# Patient Record
Sex: Male | Born: 1955
Health system: Southern US, Community
[De-identification: ages and names within clinical notes are randomized; demographics above are authoritative.]

## PROBLEM LIST (undated history)

## (undated) DIAGNOSIS — K219 Gastro-esophageal reflux disease without esophagitis: Secondary | ICD-10-CM

## (undated) DIAGNOSIS — I10 Essential (primary) hypertension: Secondary | ICD-10-CM

## (undated) DIAGNOSIS — K5790 Diverticulosis of intestine, part unspecified, without perforation or abscess without bleeding: Secondary | ICD-10-CM

## (undated) DIAGNOSIS — K635 Polyp of colon: Secondary | ICD-10-CM

## (undated) DIAGNOSIS — E785 Hyperlipidemia, unspecified: Secondary | ICD-10-CM

## (undated) HISTORY — DX: Diverticulosis of intestine, part unspecified, without perforation or abscess without bleeding: K57.90

## (undated) HISTORY — DX: Polyp of colon: K63.5

## (undated) HISTORY — DX: Gastro-esophageal reflux disease without esophagitis: K21.9

## (undated) HISTORY — PX: KNEE ARTHROSCOPY: SUR90

## (undated) HISTORY — DX: Essential (primary) hypertension: I10

## (undated) HISTORY — PX: HERNIA REPAIR: SHX51

## (undated) HISTORY — PX: CARPAL TUNNEL RELEASE: SHX101

## (undated) HISTORY — DX: Hyperlipidemia, unspecified: E78.5

---

## 2007-02-10 ENCOUNTER — Encounter: Admission: RE | Admit: 2007-02-10 | Discharge: 2007-02-10 | Payer: Self-pay | Admitting: Family Medicine

## 2009-01-28 ENCOUNTER — Encounter (INDEPENDENT_AMBULATORY_CARE_PROVIDER_SITE_OTHER): Payer: Self-pay | Admitting: *Deleted

## 2009-07-20 HISTORY — PX: COLONOSCOPY: SHX174

## 2009-07-29 ENCOUNTER — Encounter (INDEPENDENT_AMBULATORY_CARE_PROVIDER_SITE_OTHER): Payer: Self-pay | Admitting: *Deleted

## 2009-08-26 ENCOUNTER — Ambulatory Visit: Payer: Self-pay | Admitting: Gastroenterology

## 2009-08-26 ENCOUNTER — Encounter (INDEPENDENT_AMBULATORY_CARE_PROVIDER_SITE_OTHER): Payer: Self-pay | Admitting: *Deleted

## 2009-09-09 ENCOUNTER — Ambulatory Visit: Payer: Self-pay | Admitting: Gastroenterology

## 2010-08-13 ENCOUNTER — Ambulatory Visit
Admission: RE | Admit: 2010-08-13 | Discharge: 2010-08-13 | Payer: Self-pay | Source: Home / Self Care | Attending: Physician Assistant | Admitting: Physician Assistant

## 2010-08-13 ENCOUNTER — Ambulatory Visit: Admission: RE | Admit: 2010-08-13 | Discharge: 2010-08-13 | Payer: Self-pay | Source: Home / Self Care

## 2010-08-19 NOTE — Procedures (Signed)
Summary: Colonoscopy  Patient: Nathaniel Kelly Note: All result statuses are Final unless otherwise noted.  Tests: (1) Colonoscopy (COL)   COL Colonoscopy           DONE     [facility]     [address]     [place], [place]  [address]           COLONOSCOPY PROCEDURE REPORT           PATIENT:  Nathaniel, Kelly  MR#:  [id number]     BIRTHDATE:  [birth year], 54 yrs. old  GENDER:  male           ENDOSCOPIST:  Barbette Hair. Arlyce Dice, MD     Referred by:           PROCEDURE DATE:  09/09/2009     PROCEDURE:  Colonoscopy, Diagnostic     ASA CLASS:  Class II     INDICATIONS:  history of pre-cancerous (adenomatous) colon polyps                 MEDICATIONS:   Fentanyl 75 mcg IV, Versed 9 mg IV           DESCRIPTION OF PROCEDURE:   After the risks benefits and     alternatives of the procedure were thoroughly explained, informed     consent was obtained.  Digital rectal exam was performed and     revealed no abnormalities.   The LB CF-[id number] endoscope     was introduced through the anus and advanced to the cecum, which     was identified by both the appendix and ileocecal valve, without     limitations.  The quality of the prep was excellent, using     MoviPrep.  The instrument was then slowly withdrawn as the colon     was fully examined.     <<PROCEDUREIMAGES>>           FINDINGS:  Mild diverticulosis was found in the sigmoid colon (see     image1 and image11).  This was otherwise a normal examination of     the colon (see image2, image3, image5, image8, image9, image12,     and image13).   Retroflexed views in the rectum revealed no     abnormalities.    The scope was then withdrawn from the patient     and the procedure completed.           COMPLICATIONS:  None           ENDOSCOPIC IMPRESSION:     1) Mild diverticulosis in the sigmoid colon     2) Otherwise normal examination     RECOMMENDATIONS:     1) colonoscopy 10 years           REPEAT EXAM:  In 10 year(s) for  Colonoscopy.           ______________________________     Barbette Hair. Arlyce Dice, MD           CC:  Rudi Heap, MD           n.     Rosalie Doctor:   Barbette Hair. Kaplan at 09/09/2009 09:44 AM           Daleen Snook, [phone]  Note: An exclamation mark (!) indicates a result that was not dispersed into the flowsheet. Document Creation Date: 09/09/2009 9:44 AM _______________________________________________________________________  (1) Order result status: Final Collection or observation date-time: 09/09/2009 09:38 Requested date-time:  Receipt  date-time:  Reported date-time:  Referring Physician:   Ordering Physician: [name] [phone]) Specimen Source:  Source: Launa Grill Order Number: [id number] Lab site:   Appended Document: Colonoscopy    Clinical Lists Changes  Observations: Added new observation of COLONNXTDUE: 08/2019 (09/09/2009 11:02)

## 2010-08-19 NOTE — Miscellaneous (Signed)
Summary: [facility]  Clinical Lists Changes  Medications: Added new medication of MOVIPREP 100 GM  SOLR (PEG-KCL-NACL-NASULF-NA ASC-C) As per prep instructions. - Signed Rx of MOVIPREP 100 GM  SOLR (PEG-KCL-NACL-NASULF-NA ASC-C) As per prep instructions.;  #1 x 0;  Signed;  Entered by: Ezra Sites RN;  Authorized by: Louis Meckel MD;  Method used: Electron[facility] to [facility]  [phone]*, [address] [place], [place]  [address], Ph: [phone] or [phone], Fax: [phone] Observations: Added new observation of NKA: T (08/26/2009 15:52)    Prescriptions: MOVIPREP 100 GM  SOLR (PEG-KCL-NACL-NASULF-NA ASC-C) As per prep instructions.  #1 x 0   Entered by:   Ezra Sites RN   Authorized by:   Louis Meckel MD   Signed by:   Ezra Sites RN on 08/26/2009   Method used:   [facility]ronically to        [facility]  [phone]* (retail)       [address] [place], [place]  [address]       Ph: [phone] or [phone]       Fax: [phone]   RxID:   [id number]

## 2010-08-19 NOTE — Letter (Signed)
Summary: [facility] Instructions  [facility]  [address] [place], [place] [address]   Phone: [phone]  Fax: [phone]       Nathaniel Kelly    [birth year]    MRN: [id number]        Procedure Day /Date:  Monday 09/09/2009     Arrival Time: 8:00 am      Procedure Time: 9:00 am     Location of Procedure:                    _ x_  [facility] (4th Floor)                        PREPARATION FOR COLONOSCOPY WITH MOVIPREP   Starting 5 days prior to your procedure Thursday 2/17 do not eat nuts, seeds, popcorn, corn, beans, peas,  salads, or any raw vegetables.  Do not take any fiber supplements (e.g. Metamucil, Citrucel, and Benefiber).  THE DAY BEFORE YOUR PROCEDURE         DATE: Sunday 2/20  1.  Drink clear liquids the entire day-NO SOLID FOOD  2.  Do not drink anything colored red or purple.  Avoid juices with pulp.  No orange juice.  3.  Drink at least 64 oz. (8 glasses) of fluid/clear liquids during the day to prevent dehydration and help the prep work efficiently.  CLEAR LIQUIDS INCLUDE: Water Jello Ice Popsicles Tea (sugar ok, no milk/cream) Powdered fruit flavored drinks Coffee (sugar ok, no milk/cream) Gatorade Juice: apple, white grape, white cranberry  Lemonade Clear bullion, consomm, broth Carbonated beverages (any kind) Strained chicken noodle soup Hard Candy                             4.  In the morning, mix first dose of MoviPrep solution:    Empty 1 Pouch A and 1 Pouch B into the disposable container    Add lukewarm drinking water to the top line of the container. Mix to dissolve    Refrigerate (mixed solution should be used within 24 hrs)  5.  Begin drinking the prep at 5:00 p.m. The MoviPrep container is divided by 4 marks.   Every 15 minutes drink the solution down to the next mark (approximately 8 oz) until the full liter is complete.   6.  Follow completed prep with 16 oz of clear liquid of your choice (Nothing  red or purple).  Continue to drink clear liquids until bedtime.  7.  Before going to bed, mix second dose of MoviPrep solution:    Empty 1 Pouch A and 1 Pouch B into the disposable container    Add lukewarm drinking water to the top line of the container. Mix to dissolve    Refrigerate  THE DAY OF YOUR PROCEDURE      DATE: Monday 2/21  Beginning at4:00 a.m. (5 hours before procedure):         1. Every 15 minutes, drink the solution down to the next mark (approx 8 oz) until the full liter is complete.  2. Follow completed prep with 16 oz. of clear liquid of your choice.    3. You may drink clear liquids until 7:00 am  (2 HOURS BEFORE PROCEDURE).   MEDICATION INSTRUCTIONS  Unless otherwise instructed, you should take regular prescription medications with a small sip of water   as early as possible the morning of  your procedure.           OTHER INSTRUCTIONS  You will need a responsible adult at least 57 years of age to accompany you and drive you home.   This person must remain in the waiting room during your procedure.  Wear loose fitting clothing that is easily removed.  Leave jewelry and other valuables at home.  However, you may wish to bring a book to read or  an iPod/MP3 player to listen to music as you wait for your procedure to start.  Remove all body piercing jewelry and leave at home.  Total time from sign-in until discharge is approximately 2-3 hours.  You should go home directly after your procedure and rest.  You can resume normal activities the  day after your procedure.  The day of your procedure you should not:   Drive   Make legal decisions   Operate machinery   Drink alcohol   Return to work  You will receive specific instructions about eating, activities and medications before you leave.    The above instructions have been reviewed and explained to me by   Ezra Sites RN  August 26, 2009 4:15 PM    I fully understand and can  verbalize these instructions _____________________________ Date _________

## 2011-01-12 NOTE — Letter (Signed)
Summary: Previsit letter  [facility]  [address] [place], [place] [address]   Phone: [phone]  Fax: [phone]       07/29/2009 MRN: [id number]  Nathaniel Kelly [address] [place], [place]  [address]  [place]  Dear [name],  Welcome to the Gastroenterology Division at [facility].    You are scheduled to see a nurse for your pre-procedure visit on 08-09-09 at 3:30pm on the 3rd floor at [facility], [address]  We ask that you try to arrive at our office 15 minutes prior to your appointment time to allow for check-in.  Your nurse visit will consist of discussing your medical and surgical history, your immediate family medical history, and your medications.    Please bring a complete list of all your medications or, if you prefer, bring the medication bottles and we will list them.  We will need to be aware of both prescribed and over the counter drugs.  We will need to know exact dosage information as well.  If you are on blood thinners (Coumadin, Plavix, Aggrenox, Ticlid, etc.) please call our office today/prior to your appointment, as we need to consult with your physician about holding your medication.   Please be prepared to read and sign documents such as consent forms, a financial agreement, and acknowledgement forms.  If necessary, and with your consent, a friend or relative is welcome to sit-in on the nurse visit with you.  Please bring your insurance card so that we may make a copy of it.  If your insurance requires a referral to see a specialist, please bring your referral form from your primary care physician.  No co-pay is required for this nurse visit.     If you cannot keep your appointment, please call [phone] to cancel or reschedule prior to your appointment date.  This allows [place] the opportunity to schedule an appointment for another patient in need of care.    Thank you for choosing [facility] for your medical needs.   We appreciate the opportunity to care for you.  Please visit [place] at our website  to learn more about our practice.                     Sincerely.                                                                                                                   The Gastroenterology Division

## 2011-02-20 ENCOUNTER — Emergency Department (HOSPITAL_COMMUNITY)
Admission: EM | Admit: 2011-02-20 | Discharge: 2011-02-20 | Disposition: A | Discharge status: Home / Self Care | Payer: BC Managed Care – PPO | Attending: Emergency Medicine | Admitting: Emergency Medicine

## 2011-02-20 DIAGNOSIS — I10 Essential (primary) hypertension: Secondary | ICD-10-CM | POA: Insufficient documentation

## 2011-02-20 DIAGNOSIS — E7800 Pure hypercholesterolemia, unspecified: Secondary | ICD-10-CM | POA: Insufficient documentation

## 2011-02-20 DIAGNOSIS — H1130 Conjunctival hemorrhage, unspecified eye: Secondary | ICD-10-CM | POA: Insufficient documentation

## 2012-09-30 ENCOUNTER — Encounter: Payer: Self-pay | Admitting: Gastroenterology

## 2012-10-19 ENCOUNTER — Encounter: Payer: Self-pay | Admitting: Gastroenterology

## 2012-10-19 ENCOUNTER — Ambulatory Visit (INDEPENDENT_AMBULATORY_CARE_PROVIDER_SITE_OTHER): Payer: BC Managed Care – PPO | Admitting: Gastroenterology

## 2012-10-19 VITALS — BP 132/80 | HR 64 | Ht 65.0 in | Wt 143.0 lb

## 2012-10-19 DIAGNOSIS — K648 Other hemorrhoids: Secondary | ICD-10-CM

## 2012-10-19 HISTORY — DX: Other hemorrhoids: K64.8

## 2012-10-19 NOTE — Patient Instructions (Addendum)
Your hospital procedure has been scheduled Separate instructions given

## 2012-10-19 NOTE — Progress Notes (Signed)
History of Present Illness: Pleasant 57-year-old white male referred at the request of Dr. Moore for evaluation of hemorrhoids. He's had hemorrhoidal symptoms for years characterized by pain, protrusion and occasional bleeding. He's used various topicals without sustained relief.  Last colonoscopy in 2011 demonstrated diverticulosis. He moves his bowels regularly.  Patient has had reflux for years that is well controlled with PPI therapy. He denies dysphagia.    Past Medical History  Diagnosis Date  . Diverticulosis   . Hypertension   . Hyperlipemia   . Colon polyps    Past Surgical History  Procedure Laterality Date  . Hernia repair    . Knee arthroscopy      bilateral   . Carpal tunnel release     family history includes Colon cancer in his paternal aunt and paternal grandmother. Current Outpatient Prescriptions  Medication Sig Dispense Refill  . lisinopril (PRINIVIL,ZESTRIL) 20 MG tablet Please take as directed      . omeprazole (PRILOSEC) 40 MG capsule Please take as directed       No current facility-administered medications for this visit.   Allergies as of 10/19/2012  . (No Known Allergies)    reports that he has quit smoking. He has never used smokeless tobacco. He reports that  drinks alcohol. He reports that he does not use illicit drugs.     Review of Systems: Pertinent positive and negative review of systems were noted in the above HPI section. All other review of systems were otherwise negative.  Vital signs were reviewed in today's medical record Physical Exam: General: Well developed , well nourished, no acute distress Skin: anicteric Head: Normocephalic and atraumatic Eyes:  sclerae anicteric, EOMI Ears: Normal auditory acuity Mouth: No deformity or lesions Neck: Supple, no masses or thyromegaly Lungs: Clear throughout to auscultation Heart: Regular rate and rhythm; no murmurs, rubs or bruits Abdomen: Soft, non tender and non distended. No masses,  hepatosplenomegaly or hernias noted. Normal Bowel sounds Rectal: There are no external lesions Musculoskeletal: Symmetrical with no gross deformities  Skin: No lesions on visible extremities Pulses:  Normal pulses noted Extremities: No clubbing, cyanosis, edema or deformities noted Neurological: Alert oriented x 4, grossly nonfocal Cervical Nodes:  No significant cervical adenopathy Inguinal Nodes: No significant inguinal adenopathy Psychological:  Alert and cooperative. Normal mood and affect       

## 2012-10-19 NOTE — Assessment & Plan Note (Signed)
History of grade 2-3 hemorrhoids symptomatic despite medical therapy  Recommendations #1 therapies for hemorrhoids including band ligation and surgical thyroidectomy were discussed. Patient is interested in band ligation.

## 2012-10-20 ENCOUNTER — Telehealth: Payer: Self-pay | Admitting: *Deleted

## 2012-10-20 NOTE — Telephone Encounter (Signed)
See other phone note.Marland Kitchenappoints made

## 2012-11-07 ENCOUNTER — Ambulatory Visit (HOSPITAL_COMMUNITY)
Admission: RE | Admit: 2012-11-07 | Discharge: 2012-11-07 | Disposition: A | Discharge status: Home / Self Care | Payer: BC Managed Care – PPO | Source: Ambulatory Visit | Attending: Gastroenterology | Admitting: Gastroenterology

## 2012-11-07 ENCOUNTER — Encounter (HOSPITAL_COMMUNITY)
Admission: RE | Disposition: A | Discharge status: Home / Self Care | Payer: Self-pay | Source: Ambulatory Visit | Attending: Gastroenterology

## 2012-11-07 ENCOUNTER — Encounter (HOSPITAL_COMMUNITY): Payer: Self-pay | Admitting: *Deleted

## 2012-11-07 DIAGNOSIS — E785 Hyperlipidemia, unspecified: Secondary | ICD-10-CM | POA: Insufficient documentation

## 2012-11-07 DIAGNOSIS — Z860100 Personal history of colon polyps, unspecified: Secondary | ICD-10-CM | POA: Insufficient documentation

## 2012-11-07 DIAGNOSIS — Z79899 Other long term (current) drug therapy: Secondary | ICD-10-CM | POA: Insufficient documentation

## 2012-11-07 DIAGNOSIS — K648 Other hemorrhoids: Secondary | ICD-10-CM

## 2012-11-07 DIAGNOSIS — K5730 Diverticulosis of large intestine without perforation or abscess without bleeding: Secondary | ICD-10-CM | POA: Insufficient documentation

## 2012-11-07 DIAGNOSIS — K219 Gastro-esophageal reflux disease without esophagitis: Secondary | ICD-10-CM | POA: Insufficient documentation

## 2012-11-07 DIAGNOSIS — Z87891 Personal history of nicotine dependence: Secondary | ICD-10-CM | POA: Insufficient documentation

## 2012-11-07 DIAGNOSIS — Z800 Family history of malignant neoplasm of digestive organs: Secondary | ICD-10-CM | POA: Insufficient documentation

## 2012-11-07 DIAGNOSIS — I10 Essential (primary) hypertension: Secondary | ICD-10-CM | POA: Insufficient documentation

## 2012-11-07 HISTORY — PX: FLEXIBLE SIGMOIDOSCOPY: SHX5431

## 2012-11-07 HISTORY — PX: HEMORRHOID BANDING: SHX5850

## 2012-11-07 SURGERY — SIGMOIDOSCOPY, FLEXIBLE
Anesthesia: Moderate Sedation

## 2012-11-07 MED ORDER — MIDAZOLAM HCL 10 MG/2ML IJ SOLN
INTRAMUSCULAR | Status: AC
  Filled 2012-11-07: qty 2

## 2012-11-07 MED ORDER — FENTANYL CITRATE 0.05 MG/ML IJ SOLN
INTRAMUSCULAR | Status: DC | PRN
  Administered 2012-11-07 (×2): 25 ug via INTRAVENOUS

## 2012-11-07 MED ORDER — SODIUM CHLORIDE 0.9 % IV SOLN
INTRAVENOUS | Status: DC
  Administered 2012-11-07: 08:00:00 via INTRAVENOUS

## 2012-11-07 MED ORDER — MIDAZOLAM HCL 10 MG/2ML IJ SOLN
INTRAMUSCULAR | Status: DC | PRN
  Administered 2012-11-07: 2 mg via INTRAVENOUS
  Administered 2012-11-07: 1 mg via INTRAVENOUS

## 2012-11-07 MED ORDER — FENTANYL CITRATE 0.05 MG/ML IJ SOLN
INTRAMUSCULAR | Status: AC
  Filled 2012-11-07: qty 2

## 2012-11-07 NOTE — Interval H&P Note (Signed)
History and Physical Interval Note:  11/07/2012 9:43 AM  Nathaniel Kelly  has presented today for surgery, with the diagnosis of hemorrhoids  The various methods of treatment have been discussed with the patient and family. After consideration of risks, benefits and other options for treatment, the patient has consented to  Procedure(s): FLEXIBLE SIGMOIDOSCOPY (N/A) HEMORRHOID BANDING (N/A) as a surgical intervention .  The patient's history has been reviewed, patient examined, no change in status, stable for surgery.  I have reviewed the patient's chart and labs.  Questions were answered to the patient's satisfaction.    The recent H&P (dated *10/19/12**) was reviewed, the patient was examined and there is no change in the patients condition since that H&P was completed.   [name]  11/07/2012, 9:43 AM    [name]

## 2012-11-07 NOTE — H&P (View-Only) (Signed)
History of Present Illness: Pleasant 57 year old white male referred at the request of Dr. Christell Constant for evaluation of hemorrhoids. He's had hemorrhoidal symptoms for years characterized by pain, protrusion and occasional bleeding. He's used various topicals without sustained relief.  Last colonoscopy in 2011 demonstrated diverticulosis. He moves his bowels regularly.  Patient has had reflux for years that is well controlled with PPI therapy. He denies dysphagia.    Past Medical History  Diagnosis Date  . Diverticulosis   . Hypertension   . Hyperlipemia   . Colon polyps    Past Surgical History  Procedure Laterality Date  . Hernia repair    . Knee arthroscopy      bilateral   . Carpal tunnel release     family history includes Colon cancer in his paternal aunt and paternal grandmother. Current Outpatient Prescriptions  Medication Sig Dispense Refill  . lisinopril (PRINIVIL,ZESTRIL) 20 MG tablet Please take as directed      . omeprazole (PRILOSEC) 40 MG capsule Please take as directed       No current facility-administered medications for this visit.   Allergies as of 10/19/2012  . (No Known Allergies)    reports that he has quit smoking. He has never used smokeless tobacco. He reports that  drinks alcohol. He reports that he does not use illicit drugs.     Review of Systems: Pertinent positive and negative review of systems were noted in the above HPI section. All other review of systems were otherwise negative.  Vital signs were reviewed in today's medical record Physical Exam: General: Well developed , well nourished, no acute distress Skin: anicteric Head: Normocephalic and atraumatic Eyes:  sclerae anicteric, EOMI Ears: Normal auditory acuity Mouth: No deformity or lesions Neck: Supple, no masses or thyromegaly Lungs: Clear throughout to auscultation Heart: Regular rate and rhythm; no murmurs, rubs or bruits Abdomen: Soft, non tender and non distended. No masses,  hepatosplenomegaly or hernias noted. Normal Bowel sounds Rectal: There are no external lesions Musculoskeletal: Symmetrical with no gross deformities  Skin: No lesions on visible extremities Pulses:  Normal pulses noted Extremities: No clubbing, cyanosis, edema or deformities noted Neurological: Alert oriented x 4, grossly nonfocal Cervical Nodes:  No significant cervical adenopathy Inguinal Nodes: No significant inguinal adenopathy Psychological:  Alert and cooperative. Normal mood and affect

## 2012-11-07 NOTE — Op Note (Signed)
[facility] [address] [place], [address]   FLEXIBLE SIGMOIDOSCOPY PROCEDURE REPORT  PATIENT: Nathaniel Kelly, Nathaniel Kelly  MR#: [id number] BIRTHDATE: 05-08-1956 , 57  yrs. old GENDER: Male ENDOSCOPIST: Louis Meckel, MD REFERRED BY: Rudi Heap, M.D. PROCEDURE DATE:  11/07/2012 PROCEDURE:   Hemorrhoidectomy via banding, clips or ligation ASA CLASS:   Class II INDICATIONS:therapy of for previously diagnosed hemorrhoids. MEDICATIONS: These medications were titrated to patient response per physician's verbal order, Versed 3 mg IV, and Fentanyl 50 mcg IV  DESCRIPTION OF PROCEDURE:   After the risks benefits and alternatives of the procedure were thoroughly explained, informed consent was obtained.  revealed no abnormalities of the rectum. The endoscope was introduced through the anus  and advanced to the sigmoid colon , limited by No adverse events experienced.   The quality of the prep was    .  The instrument was then slowly withdrawn as the mucosa was fully examined.         COLON FINDINGS: Internal hemorrhoids were found.  There were 2 internal hemorrhoid bundles. 3 bands were placed just above the dentate line incorporating the 2 bundles.  The sigmoid colon was normal. Retroflexed views revealed no abnormalities.    The scope was then withdrawn from the patient and the procedure terminated.  COMPLICATIONS: There were no complications.  ENDOSCOPIC IMPRESSION: internal hemorrhoids-status post band ligation  RECOMMENDATIONS: Office visit one month  REPEAT EXAM:   _______________________________ eSignedLouis Meckel, MD 11/07/2012 10:06 AM   CC:

## 2012-11-08 ENCOUNTER — Encounter (HOSPITAL_COMMUNITY): Payer: Self-pay | Admitting: Gastroenterology

## 2012-12-08 ENCOUNTER — Ambulatory Visit: Payer: BC Managed Care – PPO | Admitting: Gastroenterology

## 2012-12-21 ENCOUNTER — Telehealth: Payer: Self-pay | Admitting: Family Medicine

## 2012-12-22 MED ORDER — OMEPRAZOLE 40 MG PO CPDR: 40.0000 mg | DELAYED_RELEASE_CAPSULE | Freq: Every day | ORAL | Status: DC

## 2012-12-22 NOTE — Telephone Encounter (Signed)
done

## 2012-12-26 ENCOUNTER — Ambulatory Visit: Payer: BC Managed Care – PPO | Admitting: Gastroenterology

## 2013-04-22 ENCOUNTER — Other Ambulatory Visit: Payer: Self-pay | Admitting: Family Medicine

## 2013-04-25 NOTE — Telephone Encounter (Signed)
Last seen 3/14  ACM

## 2013-09-07 ENCOUNTER — Ambulatory Visit (INDEPENDENT_AMBULATORY_CARE_PROVIDER_SITE_OTHER): Payer: BC Managed Care – PPO | Admitting: Family Medicine

## 2013-09-07 ENCOUNTER — Encounter (INDEPENDENT_AMBULATORY_CARE_PROVIDER_SITE_OTHER): Payer: Self-pay

## 2013-09-07 ENCOUNTER — Encounter: Payer: Self-pay | Admitting: Family Medicine

## 2013-09-07 VITALS — BP 128/82 | HR 58 | Temp 98.2°F | Ht 65.0 in | Wt 201.0 lb

## 2013-09-07 DIAGNOSIS — K219 Gastro-esophageal reflux disease without esophagitis: Secondary | ICD-10-CM

## 2013-09-07 DIAGNOSIS — I10 Essential (primary) hypertension: Secondary | ICD-10-CM

## 2013-09-07 LAB — POCT CBC
Granulocyte percent: 69.2 %G (ref 37–80)
HCT, POC: 48.2 % (ref 43.5–53.7)
Hemoglobin: 16 g/dL (ref 14.1–18.1)
Lymph, poc: 1.7 (ref 0.6–3.4)
MCH, POC: 30.2 pg (ref 27–31.2)
MCHC: 33.3 g/dL (ref 31.8–35.4)
MCV: 90.8 fL (ref 80–97)
MPV: 7.6 fL (ref 0–99.8)
POC Granulocyte: 4.4 (ref 2–6.9)
POC LYMPH PERCENT: 27.1 %L (ref 10–50)
Platelet Count, POC: 221 10*3/uL (ref 142–424)
RBC: 5.3 M/uL (ref 4.69–6.13)
RDW, POC: 13.4 %
WBC: 6.3 10*3/uL (ref 4.6–10.2)

## 2013-09-07 MED ORDER — LISINOPRIL 20 MG PO TABS: 20.0000 mg | ORAL_TABLET | Freq: Every day | ORAL | Status: DC

## 2013-09-07 MED ORDER — OMEPRAZOLE 40 MG PO CPDR: 40.0000 mg | DELAYED_RELEASE_CAPSULE | Freq: Every day | ORAL | Status: DC

## 2013-09-07 NOTE — Patient Instructions (Signed)
Call insurance company about coverage of Zostavax (shingles vaccine), Prevnar (Pneumonia vaccine), Tdap (tetanus)

## 2013-09-07 NOTE — Progress Notes (Signed)
   Subjective:    Patient ID: Nathaniel Kelly, male    DOB: [birth year], 58 y.o.   MRN: [id number]  HPI This 58 y.o. male presents for evaluation of hypertension, and GERD.  He has hx  Of hyperlipidemia and has been intolerant to statins due to arthritis.  He has hx of DDD Of the LS spine and is trying to avoid surgery.   Review of Systems C/o arthritis No chest pain, SOB, HA, dizziness, vision change, N/V, diarrhea, constipation, dysuria, urinary urgency or frequency or rash.     Objective:   Physical Exam  Vital signs noted  Well developed well nourished male.  HEENT - Head atraumatic Normocephalic                Eyes - PERRLA, Conjuctiva - clear Sclera- Clear EOMI                Ears - EAC's Wnl TM's Wnl Gross Hearing WNL                Nose - Nares patent                 Throat - oropharanx wnl Respiratory - Lungs CTA bilateral Cardiac - RRR S1 and S2 without murmur GI - Abdomen soft Nontender and bowel sounds active x 4 Extremities - No edema. Neuro - Grossly intact.      Assessment & Plan:  Essential hypertension, benign - Plan: lisinopril (PRINIVIL,ZESTRIL) 20 MG tablet, POCT CBC, CMP14+EGFR, Lipid panel, PSA, total and free, TSH  GERD (gastroesophageal reflux disease) - Plan: omeprazole (PRILOSEC) 40 MG capsule  Lysbeth Penner FNP

## 2013-09-08 ENCOUNTER — Ambulatory Visit (INDEPENDENT_AMBULATORY_CARE_PROVIDER_SITE_OTHER): Payer: BC Managed Care – PPO | Admitting: *Deleted

## 2013-09-08 DIAGNOSIS — Z23 Encounter for immunization: Secondary | ICD-10-CM

## 2013-09-08 LAB — CMP14+EGFR
ALT: 8 IU/L (ref 0–44)
AST: 17 IU/L (ref 0–40)
Albumin/Globulin Ratio: 1.6 (ref 1.1–2.5)
Albumin: 4.3 g/dL (ref 3.6–4.8)
Alkaline Phosphatase: 69 IU/L (ref 39–117)
BUN/Creatinine Ratio: 13 (ref 10–22)
BUN: 13 mg/dL (ref 8–27)
CO2: 27 mmol/L (ref 18–29)
Calcium: 10 mg/dL (ref 8.6–10.2)
Chloride: 95 mmol/L — ABNORMAL LOW (ref 97–108)
Creatinine, Ser: 1.04 mg/dL (ref 0.76–1.27)
GFR calc Af Amer: 90 mL/min/{1.73_m2} (ref >59)
GFR calc non Af Amer: 78 mL/min/{1.73_m2} (ref >59)
Globulin, Total: 2.7 g/dL (ref 1.5–4.5)
Glucose: 92 mg/dL (ref 65–99)
Potassium: 4.4 mmol/L (ref 3.5–5.2)
Sodium: 137 mmol/L (ref 134–144)
Total Bilirubin: 1.3 mg/dL — ABNORMAL HIGH (ref 0.0–1.2)
Total Protein: 7 g/dL (ref 6.0–8.5)

## 2013-09-08 LAB — LIPID PANEL
Chol/HDL Ratio: 4.7 ratio units (ref 0.0–5.0)
Cholesterol, Total: 305 mg/dL — ABNORMAL HIGH (ref 100–199)
HDL: 65 mg/dL (ref >39)
LDL Calculated: 203 mg/dL — ABNORMAL HIGH (ref 0–99)
Triglycerides: 187 mg/dL — ABNORMAL HIGH (ref 0–149)
VLDL Cholesterol Cal: 37 mg/dL (ref 5–40)

## 2013-09-08 LAB — TSH: TSH: 1.72 u[IU]/mL (ref 0.450–4.500)

## 2013-09-08 LAB — PSA, TOTAL AND FREE
PSA, Free Pct: 22.2 %
PSA, Free: 0.2 ng/mL
PSA: 0.9 ng/mL (ref 0.0–4.0)

## 2013-09-08 NOTE — Progress Notes (Signed)
Patient ID: Nathaniel Kelly, male   DOB: 06/26/1956, 58 y.o.   MRN: [id number] Tdap and prevnar given and patient tolerated well

## 2013-09-08 NOTE — Patient Instructions (Signed)
Tetanus, Diphtheria (Td) Vaccine What You Need to Know WHY GET VACCINATED? Tetanus  and diphtheria are very serious diseases. They are rare in the [place] today, but people who do become infected often have severe complications. Td vaccine is used to protect adolescents and adults from both of these diseases. Both tetanus and diphtheria are infections caused by bacteria. Diphtheria spreads from person to person through coughing or sneezing. Tetanus-causing bacteria enter the body through cuts, scratches, or wounds. TETANUS (Lockjaw) causes painful muscle tightening and stiffness, usually all over the body.  It can lead to tightening of muscles in the head and neck so you can't open your mouth, swallow, or sometimes even breathe. Tetanus kills about 1 out of every 5 people who are infected. DIPHTHERIA can cause a thick coating to form in the back of the throat.  It can lead to breathing problems, paralysis, heart failure, and death. Before vaccines, the [place] saw as many as 200,000 cases a year of diphtheria and hundreds of cases of tetanus. Since vaccination began, cases of both diseases have dropped by about 99%. TD VACCINE Td vaccine can protect adolescents and adults from tetanus and diphtheria. Td is usually given as a booster dose every 10 years but it can also be given earlier after a severe and dirty wound or burn. Your doctor can give you more information. Td may safely be given at the same time as other vaccines. SOME PEOPLE SHOULD NOT GET THIS VACCINE  If you ever had a life-threatening allergic reaction after a dose of any tetanus or diphtheria containing vaccine, OR if you have a severe allergy to any part of this vaccine, you should not get Td. Tell your doctor if you have any severe allergies.  Talk to your doctor if you:  have epilepsy or another nervous system problem,  had severe pain or swelling after any vaccine containing diphtheria or tetanus,  ever had  Guillain Barr Syndrome (GBS),  aren't feeling well on the day the shot is scheduled. RISKS OF A VACCINE REACTION With a vaccine, like any medicine, there is a chance of side effects. These are usually mild and go away on their own. Serious side effects are also possible, but are very rare. Most people who get Td vaccine do not have any problems with it. Mild Problems  following Td (Did not interfere with activities)  Pain where the shot was given (about 8 people in 10)  Redness or swelling where the shot was given (about 1 person in 3)  Mild fever (about 1 person in 15)  Headache or Tiredness (uncommon) Moderate Problems following Td (Interfered with activities, but did not require medical attention)  Fever over 102 F (38.9 C) (rare) Severe Problems  following Td (Unable to perform usual activities; required medical attention)  Swelling, severe pain, bleeding, or redness in the arm where the shot was given (rare). Problems that could happen after any vaccine:  Brief fainting spells can happen after any medical procedure, including vaccination. Sitting or lying down for about 15 minutes can help prevent fainting, and injuries caused by a fall. Tell your doctor if you feel dizzy, or have vision changes or ringing in the ears.  Severe shoulder pain and reduced range of motion in the arm where a shot was given can happen, very rarely, after a vaccination.  Severe allergic reactions from a vaccine are very rare, estimated at less than 1 in a million doses. If one were to occur, it would   usually be within a few minutes to a few hours after the vaccination. WHAT IF THERE IS A SERIOUS REACTION? What should I look for?  Look for anything that concerns you, such as signs of a severe allergic reaction, very high fever, or behavior changes. Signs of a severe allergic reaction can include hives, swelling of the face and throat, difficulty breathing, a fast heartbeat, dizziness, and  weakness. These would usually start a few minutes to a few hours after the vaccination. What should I do?  If you think it is a severe allergic reaction or other emergency that can't wait, call 911 or get the person to the nearest hospital. Otherwise, call your doctor.  Afterward, the reaction should be reported to the Vaccine Adverse Event Reporting System (VAERS). Your doctor might file this report, or, you can do it yourself through the VAERS website or by calling (727) 848-7495. VAERS is only for reporting reactions. They do not give medical advice. THE NATIONAL VACCINE INJURY COMPENSATION PROGRAM The National Vaccine Injury Compensation Program (VICP) is a federal program that was created to compensate people who may have been injured by certain vaccines. Persons who believe they may have been injured by a vaccine can learn about the program and about filing a claim by calling (773) 439-6826 or visiting the [facility] website. HOW CAN I LEARN MORE?  Ask your doctor.  Contact your local or state health department.  Contact the Centers for Disease Control and Prevention (CDC):  Call (941)832-6387 (1-800-CDC-INFO)  Visit CDC's vaccines website CDC Td Vaccine Interim VIS (08/23/12) Document Released: 05/03/2006 Document Revised: 10/31/2012 Document Reviewed: 10/26/2012 [facility] Patient Information 2014 [place], [place]. Pneumococcal Conjugate Vaccine What You Need to Know Your doctor recommends that you, or your child, get a dose of PCV13 vaccine today. WHY GET VACCINATED? Pneumococcal conjugate vaccine (called PCV13 or Prevnar 13) is recommended to protect infants and toddlers, and some older children and adults with certain health conditions, from pneumococcal disease. Pneumococcal disease is caused by infection with Streptococcus pneumoniae bacteria. These bacteria can spread from person to person through close contact. Pneumococcal disease can lead to severe health problems, including  pneumonia, blood infections, and meningitis. Meningitis is an infection of the covering of the brain. Pneumococcal meningitis is fairly rare (less than 1 case per 100,000 people each year), but it leads to other health problems, including deafness and brain damage. In children, it is fatal in about 1 case out of 10. Children younger than two are at higher risk for serious disease than older children. People with certain medical conditions, people over age 80, and cigarette smokers are also at higher risk. Before vaccine, pneumococcal infections caused many problems each year in the [place] in children younger than 5, including:  more than 700 cases of meningitis,  13,000 blood infections,  about 5 million ear infections, and  about 200 deaths. About 4,000 adults still die each year because of pneumococcal infections. Pneumococcal infections can be hard to treat because some strains are resistant to antibiotics. This makes prevention through vaccination even more important. PCV13 VACCINE There are more than 90 types of pneumococcal bacteria. PCV13 protects against 13 of them. These 13 strains cause most severe infections in children and about half of infections in adults.  PCV13 is routinely given to children at 2, 4, 6, and 61 74 months of age. Children in this age range are at greatest risk for serious diseases caused by pneumococcal infection. PCV13 vaccine may also be recommended for some older  children or adults. Your doctor can give you details. A second type of pneumococcal vaccine, called PPSV23, may also be given to some children and adults, including anyone over age 34. There is a separate Vaccine Information Statement for this vaccine. PRECAUTIONS  Anyone who has ever had a life-threatening allergic reaction to a dose of this vaccine, to an earlier pneumococcal vaccine called PCV7 (or Prevnar), or to any vaccine containing diphtheria toxoid (for example, DTaP), should not get  PCV13. Anyone with a severe allergy to any component of PCV13 should not get the vaccine. Tell your doctor if the person being vaccinated has any severe allergies. If the person scheduled for vaccination is sick, your doctor might decide to reschedule the shot on another day. Your doctor can give you more information about any of these precautions. RISKS  With any medicine, including vaccines, there is a chance of side effects. These are usually mild and go away on their own, but serious reactions are also possible. Reported problems associated with PCV13 vary by dose and age, but generally:  About half of children became drowsy after the shot, had a temporary loss of appetite, or had redness or tenderness where the shot was given.  About 1 out of 3 had swelling where the shot was given.  About 1 out of 3 had a mild fever, and about 1 in 20 had a higher fever (over 102.2 F or 39 C).  Up to about 8 out of 10 became fussy or irritable. Adults receiving the vaccine have reported redness, pain, and swelling where the shot was given. Mild fever, fatigue, headache, chills, or muscle pain have also been reported. Life-threatening allergic reactions from any vaccine are very rare. WHAT IF THERE IS A SERIOUS REACTION? What should I look for? Look for anything that concerns you, such as signs of a severe allergic reaction, very high fever, or behavior changes. Signs of a severe allergic reaction can include hives, swelling of the face and throat, difficulty breathing, a fast heartbeat, dizziness, and weakness. These would start a few minutes to a few hours after the vaccination. What should I do?  If you think it is a severe allergic reaction or other emergency that can't wait, get the person to the nearest hospital or call 9-1-1. Otherwise, call your doctor.  Afterward, the reaction should be reported to the "Vaccine Adverse Event Reporting System" (VAERS). Your doctor might file this report, or  you can do it yourself through the VAERS web site at www.vaers.SamedayNews.es, or by calling [phone]. VAERS is only for reporting reactions. They do not give medical advice. THE NATIONAL VACCINE INJURY COMPENSATION PROGRAM The National Vaccine Injury Compensation Program (VICP) was created in 1986. Persons who believe they may have been injured by a vaccine can learn about the program and about filing a claim by calling 872-561-2569 or visiting the [facility] website at GoldCloset.com.ee. HOW CAN I LEARN MORE?  Ask your doctor.  Call your local or state health department.  Contact the Centers for Disease Control and Prevention (CDC):  Call 570-667-3276 (1-800-CDC-INFO) or  Visit CDC's website at http://hunter.com/ CDC PCV13 Vaccine VIS (Interim) (09/16/11) Document Released: 05/03/2006 Document Revised: 10/31/2012 Document Reviewed: 10/26/2012 [facility] Patient Information 2014 [place].

## 2013-10-25 ENCOUNTER — Encounter: Payer: Self-pay | Admitting: Physician Assistant

## 2014-07-26 ENCOUNTER — Other Ambulatory Visit: Payer: Self-pay | Admitting: Family Medicine

## 2014-10-26 ENCOUNTER — Ambulatory Visit (INDEPENDENT_AMBULATORY_CARE_PROVIDER_SITE_OTHER): Payer: BC Managed Care – PPO | Admitting: Family Medicine

## 2014-10-26 ENCOUNTER — Encounter: Payer: Self-pay | Admitting: Family Medicine

## 2014-10-26 VITALS — BP 130/82 | HR 56 | Temp 97.5°F | Ht 65.0 in | Wt 202.0 lb

## 2014-10-26 DIAGNOSIS — R5383 Other fatigue: Secondary | ICD-10-CM | POA: Diagnosis not present

## 2014-10-26 DIAGNOSIS — I10 Essential (primary) hypertension: Secondary | ICD-10-CM

## 2014-10-26 DIAGNOSIS — K219 Gastro-esophageal reflux disease without esophagitis: Secondary | ICD-10-CM | POA: Diagnosis not present

## 2014-10-26 DIAGNOSIS — R3915 Urgency of urination: Secondary | ICD-10-CM | POA: Diagnosis not present

## 2014-10-26 DIAGNOSIS — E785 Hyperlipidemia, unspecified: Secondary | ICD-10-CM

## 2014-10-26 LAB — POCT CBC
Granulocyte percent: 65.4 %G (ref 37–80)
HCT, POC: 50.1 % (ref 43.5–53.7)
HEMOGLOBIN: 15.5 g/dL (ref 14.1–18.1)
LYMPH, POC: 1.7 (ref 0.6–3.4)
MCH: 28.3 pg (ref 27–31.2)
MCHC: 30.9 g/dL — AB (ref 31.8–35.4)
MCV: 91.6 fL (ref 80–97)
MPV: 7.6 fL (ref 0–99.8)
POC Granulocyte: 3.5 (ref 2–6.9)
POC LYMPH %: 31.3 % (ref 10–50)
Platelet Count, POC: 274 10*3/uL (ref 142–424)
RBC: 5.47 M/uL (ref 4.69–6.13)
RDW, POC: 13.9 %
WBC: 5.4 10*3/uL (ref 4.6–10.2)

## 2014-10-26 MED ORDER — LISINOPRIL 20 MG PO TABS: 20.0000 mg | ORAL_TABLET | Freq: Every day | ORAL | Status: DC

## 2014-10-26 MED ORDER — OMEPRAZOLE 40 MG PO CPDR: 40.0000 mg | DELAYED_RELEASE_CAPSULE | Freq: Every day | ORAL | Status: DC

## 2014-10-26 MED ORDER — FLUOCINONIDE 0.05 % EX CREA: 1.0000 "application " | TOPICAL_CREAM | Freq: Two times a day (BID) | CUTANEOUS | Status: DC

## 2014-10-26 NOTE — Progress Notes (Signed)
Subjective:  Patient ID: Nathaniel Kelly, male    DOB: [birth year]  Age: 59 y.o. MRN: [id number]  CC: Hypertension; Hyperlipidemia; and Vit D deficiency   HPI Nathaniel Kelly presents for above plus scoliosis. Chronic back pain. It is located in the lumbar region it is mild to moderate. He manages it with Osteo Bi-Flex. He prefers no prescription medication for that.   follow-up of hypertension. Patient has no history of headache chest pain or shortness of breath or recent cough. Patient also denies symptoms of TIA such as numbness weakness lateralizing. Patient checks  blood pressure at home and has had moderately elevated readings recently. Patient denies medication.  Patient in for follow-up of elevated cholesterol. Doing well without complaints but not taking medication. Currently no chest pain, shortness of breath or other cardiovascular related symptoms noted in spite of not taking risk factor reducing medications.  History Nathaniel Kelly has a past medical history of Diverticulosis; Hypertension; Hyperlipemia; Colon polyps; and GERD (gastroesophageal reflux disease).   He has past surgical history that includes Hernia repair; Knee arthroscopy; Carpal tunnel release; Flexible sigmoidoscopy (N/A, 11/07/2012); and Hemorrhoid banding (N/A, 11/07/2012).   His family history includes Colon cancer in his paternal aunt and paternal grandmother.He reports that he quit smoking about 28 years ago. He has never used smokeless tobacco. He reports that he drinks alcohol. He reports that he does not use illicit drugs.  Current Outpatient Prescriptions on File Prior to Visit  Medication Sig Dispense Refill  . aspirin 81 MG chewable tablet Chew 81 mg by mouth daily.    . Glucosamine-Chondroitin (OSTEO BI-FLEX REGULAR STRENGTH PO) Take 2 capsules by mouth daily.     No current facility-administered medications on file prior to visit.    ROS Review of Systems  Objective:  BP 130/82 mmHg  Pulse 56  Temp(Src) 97.5  F (36.4 C) (Oral)  Ht _0  (1.651 m)  Wt 202 lb (91.627 kg)  BMI 33.61 kg/m2  BP Readings from Last 3 Encounters:  10/26/14 130/82  09/07/13 128/82  11/07/12 138/86    Wt Readings from Last 3 Encounters:  10/26/14 202 lb (91.627 kg)  09/07/13 201 lb (91.173 kg)  11/07/12 200 lb (90.719 kg)     Physical Exam  No results found for: HGBA1C  Lab Results  Component Value Date   WBC 5.4 10/26/2014   HGB 15.5 10/26/2014   HCT 50.1 10/26/2014   GLUCOSE 93 10/26/2014   CHOL 271* 10/26/2014   TRIG 95 10/26/2014   HDL 69 10/26/2014   LDLCALC 203* 09/07/2013   ALT 8 10/26/2014   AST 19 10/26/2014   NA 136 10/26/2014   K 4.5 10/26/2014   CL 97 10/26/2014   CREATININE 1.05 10/26/2014   BUN 19 10/26/2014   CO2 23 10/26/2014   TSH 1.430 10/26/2014   PSA 0.9 10/26/2014    No results found.  Assessment & Plan:   Nathaniel Kelly was seen today for hypertension, hyperlipidemia and vit d deficiency.  Diagnoses and all orders for this visit:  Hyperlipemia Orders: -     NMR, lipoprofile  Essential hypertension Orders: -     CMP14+EGFR  Other fatigue Orders: -     POCT CBC -     Vit D  25 hydroxy (rtn osteoporosis monitoring) -     Thyroid Panel With TSH  Urinary urgency Orders: -     PSA, total and free  Essential hypertension, benign Orders: -     Discontinue: lisinopril (PRINIVIL,ZESTRIL) 20 MG  tablet; Take 1 tablet (20 mg total) by mouth daily. Please take as directed -     lisinopril (PRINIVIL,ZESTRIL) 20 MG tablet; Take 1 tablet (20 mg total) by mouth daily. Please take as directed  Gastroesophageal reflux disease without esophagitis Orders: -     Discontinue: omeprazole (PRILOSEC) 40 MG capsule; Take 1 capsule (40 mg total) by mouth daily. -     omeprazole (PRILOSEC) 40 MG capsule; Take 1 capsule (40 mg total) by mouth daily.  Other orders -     fluocinonide cream (LIDEX) 0.05 %; Apply 1 application topically 2 (two) times daily.   I am having Nathaniel Kelly  start on fluocinonide cream. I am also having him maintain his aspirin, Glucosamine-Chondroitin (OSTEO BI-FLEX REGULAR STRENGTH PO), omeprazole, and lisinopril.  Meds ordered this encounter  Medications  . DISCONTD: lisinopril (PRINIVIL,ZESTRIL) 20 MG tablet    Sig: Take 1 tablet (20 mg total) by mouth daily. Please take as directed    Dispense:  90 tablet    Refill:  3  . DISCONTD: omeprazole (PRILOSEC) 40 MG capsule    Sig: Take 1 capsule (40 mg total) by mouth daily.    Dispense:  90 capsule    Refill:  3  . fluocinonide cream (LIDEX) 0.05 %    Sig: Apply 1 application topically 2 (two) times daily.    Dispense:  120 g    Refill:  3  . omeprazole (PRILOSEC) 40 MG capsule    Sig: Take 1 capsule (40 mg total) by mouth daily.    Dispense:  90 capsule    Refill:  3  . lisinopril (PRINIVIL,ZESTRIL) 20 MG tablet    Sig: Take 1 tablet (20 mg total) by mouth daily. Please take as directed    Dispense:  90 tablet    Refill:  3     Follow-up: Return in about 3 months (around 01/25/2015) for cholesterol, hypertension.  Claretta Fraise, M.D.

## 2014-10-27 LAB — NMR, LIPOPROFILE
Cholesterol: 271 mg/dL — ABNORMAL HIGH (ref 100–199)
HDL CHOLESTEROL BY NMR: 69 mg/dL (ref >39)
HDL PARTICLE NUMBER: 38.1 umol/L (ref >=30.5)
LDL Particle Number: 1994 nmol/L — ABNORMAL HIGH (ref <1000)
LDL Size: 21.2 nm (ref >20.5)
LDL-C: 183 mg/dL — ABNORMAL HIGH (ref 0–99)
LP-IR Score: <25 (ref <=45)
Small LDL Particle Number: 334 nmol/L (ref <=527)
Triglycerides by NMR: 95 mg/dL (ref 0–149)

## 2014-10-27 LAB — CMP14+EGFR
A/G RATIO: 1.7 (ref 1.1–2.5)
ALT: 8 IU/L (ref 0–44)
AST: 19 IU/L (ref 0–40)
Albumin: 4.3 g/dL (ref 3.6–4.8)
Alkaline Phosphatase: 77 IU/L (ref 39–117)
BILIRUBIN TOTAL: 1.1 mg/dL (ref 0.0–1.2)
BUN/Creatinine Ratio: 18 (ref 10–22)
BUN: 19 mg/dL (ref 8–27)
CO2: 23 mmol/L (ref 18–29)
Calcium: 9.5 mg/dL (ref 8.6–10.2)
Chloride: 97 mmol/L (ref 97–108)
Creatinine, Ser: 1.05 mg/dL (ref 0.76–1.27)
GFR calc Af Amer: 88 mL/min/{1.73_m2} (ref >59)
GFR calc non Af Amer: 76 mL/min/{1.73_m2} (ref >59)
Globulin, Total: 2.5 g/dL (ref 1.5–4.5)
Glucose: 93 mg/dL (ref 65–99)
Potassium: 4.5 mmol/L (ref 3.5–5.2)
Sodium: 136 mmol/L (ref 134–144)
TOTAL PROTEIN: 6.8 g/dL (ref 6.0–8.5)

## 2014-10-27 LAB — VITAMIN D 25 HYDROXY (VIT D DEFICIENCY, FRACTURES): Vit D, 25-Hydroxy: 26.5 ng/mL — ABNORMAL LOW (ref 30.0–100.0)

## 2014-10-27 LAB — THYROID PANEL WITH TSH
Free Thyroxine Index: 2.6 (ref 1.2–4.9)
T3 Uptake Ratio: 26 % (ref 24–39)
T4 TOTAL: 10 ug/dL (ref 4.5–12.0)
TSH: 1.43 u[IU]/mL (ref 0.450–4.500)

## 2014-10-27 LAB — PSA, TOTAL AND FREE
PSA, Free Pct: 22.2 %
PSA, Free: 0.2 ng/mL
PSA: 0.9 ng/mL (ref 0.0–4.0)

## 2014-10-29 ENCOUNTER — Telehealth: Payer: Self-pay | Admitting: Family Medicine

## 2014-10-29 MED ORDER — ATORVASTATIN CALCIUM 40 MG PO TABS: 40.0000 mg | ORAL_TABLET | Freq: Every day | ORAL | Status: DC

## 2014-10-29 MED ORDER — VITAMIN D (ERGOCALCIFEROL) 1.25 MG (50000 UNIT) PO CAPS: 50000.0000 [IU] | ORAL_CAPSULE | ORAL | Status: DC

## 2014-10-29 NOTE — Telephone Encounter (Signed)
Pt aware of lab results & prescriptions sent to [facility]

## 2014-10-29 NOTE — Progress Notes (Signed)
lmtcb

## 2014-10-29 NOTE — Telephone Encounter (Signed)
-----   Message from Claretta Fraise, MD sent at 10/28/2014  7:04 PM EDT ----- Your cholesterol is too high. It would benefit from medication. Without treatment, risk for heart attack, stroke and other medical conditions is high. I recommend atorvastatin. 40 mg daily with supper should reduce your risk significantly.   Vitamin D quite low. Needs supplement.Use 50,000 units twice weekly for two months. Then switch to OTC Vitamin D 2000 units daily. REcehck vitamin D level with office visit in 6 months.   The remainder of your results were essentially normal. A few minor variations may occur but nothing of significance was noted.

## 2014-11-20 ENCOUNTER — Ambulatory Visit (INDEPENDENT_AMBULATORY_CARE_PROVIDER_SITE_OTHER): Payer: BC Managed Care – PPO | Admitting: Nurse Practitioner

## 2014-11-20 ENCOUNTER — Encounter: Payer: Self-pay | Admitting: Nurse Practitioner

## 2014-11-20 VITALS — BP 122/75 | HR 85 | Temp 100.5°F | Ht 65.0 in | Wt 198.0 lb

## 2014-11-20 DIAGNOSIS — R509 Fever, unspecified: Secondary | ICD-10-CM

## 2014-11-20 DIAGNOSIS — W57XXXA Bitten or stung by nonvenomous insect and other nonvenomous arthropods, initial encounter: Secondary | ICD-10-CM

## 2014-11-20 MED ORDER — DOXYCYCLINE HYCLATE 100 MG PO TABS: 100.0000 mg | ORAL_TABLET | Freq: Two times a day (BID) | ORAL | Status: DC

## 2014-11-20 NOTE — Progress Notes (Signed)
  Subjective:     Nathaniel Kelly is a 59 y.o. male who presents for evaluation of fever. He has had the fever for 4 days. Symptoms have been gradually worsening. Symptoms are described as fevers up to 101 degrees, and are worse in the evening. Associated symptoms are body aches, chills, fatigue, headache and URI symptoms. Patient denies abdominal pain, urinary tract symptoms and vomiting.  He has tried to alleviate the symptoms with aspirin with minimal relief. The patient has no known comorbidities (structural heart/valvular disease, prosthetic joints, immunocompromised state, recent dental work, known abscesses).  The following portions of the patient's history were reviewed and updated as appropriate: allergies, current medications, past family history, past medical history, past social history, past surgical history and problem list.  Review of Systems Pertinent items are noted in HPI.   Objective:    BP 122/75 mmHg  Pulse 85  Temp(Src) 100.5 F (38.1 C) (Oral)  Ht 5\' 5"  (1.651 m)  Wt 198 lb (89.812 kg)  BMI 32.95 kg/m2 General appearance: alert and cooperative Eyes: conjunctivae/corneas clear. PERRL, EOM's intact. Fundi benign. Ears: normal TM's and external ear canals both ears Nose: Nares normal. Septum midline. Mucosa normal. No drainage or sinus tenderness. Throat: lips, mucosa, and tongue normal; teeth and gums normal Neck: no adenopathy, no carotid bruit, no JVD, supple, symmetrical, trachea midline and thyroid not enlarged, symmetric, no tenderness/mass/nodules Lungs: clear to auscultation bilaterally Heart: regular rate and rhythm, S1, S2 normal, no murmur, click, rub or gallop Skin: Skin color, texture, turgor normal. No rashes or lesions  erythematous spot with bluish discoloration left inner upper thigh.  Assessment:    Fever is likely secondary to tick bite.   Plan:   1. Tick bite   2. Fever, unspecified fever cause    Meds ordered this encounter  Medications  .  doxycycline (VIBRA-TABS) 100 MG tablet    Sig: Take 1 tablet (100 mg total) by mouth 2 (two) times daily. 1 po bid    Dispense:  28 tablet    Refill:  0    Order Specific Question:  Supervising Provider    Answer:  Chipper Herb [[id number]]   Force fluids Rest  RTO Prn  Mary-Margaret Hassell Done,

## 2014-11-20 NOTE — Patient Instructions (Signed)
Tick Bite Information Ticks are insects that attach themselves to the skin and draw blood for food. There are various types of ticks. Common types include wood ticks and deer ticks. Most ticks live in shrubs and grassy areas. Ticks can climb onto your body when you make contact with leaves or grass where the tick is waiting. The most common places on the body for ticks to attach themselves are the scalp, neck, armpits, waist, and groin. Most tick bites are harmless, but sometimes ticks carry germs that cause diseases. These germs can be spread to a person during the tick's feeding process. The chance of a disease spreading through a tick bite depends on:   The type of tick.  Time of year.   How long the tick is attached.   Geographic location.  HOW CAN YOU PREVENT TICK BITES? Take these steps to help prevent tick bites when you are outdoors:  Wear protective clothing. Long sleeves and long pants are best.   Wear white clothes so you can see ticks more easily.  Tuck your pant legs into your socks.   If walking on a trail, stay in the middle of the trail to avoid brushing against bushes.  Avoid walking through areas with long grass.  Put insect repellent on all exposed skin and along boot tops, pant legs, and sleeve cuffs.   Check clothing, hair, and skin repeatedly and before going inside.   Brush off any ticks that are not attached.  Take a shower or bath as soon as possible after being outdoors.  WHAT IS THE PROPER WAY TO REMOVE A TICK? Ticks should be removed as soon as possible to help prevent diseases caused by tick bites. 1. If latex gloves are available, put them on before trying to remove a tick.  2. Using fine-point tweezers, grasp the tick as close to the skin as possible. You may also use curved forceps or a tick removal tool. Grasp the tick as close to its head as possible. Avoid grasping the tick on its body. 3. Pull gently with steady upward pressure until  the tick lets go. Do not twist the tick or jerk it suddenly. This may break off the tick's head or mouth parts. 4. Do not squeeze or crush the tick's body. This could force disease-carrying fluids from the tick into your body.  5. After the tick is removed, wash the bite area and your hands with soap and water or other disinfectant such as alcohol. 6. Apply a small amount of antiseptic cream or ointment to the bite site.  7. Wash and disinfect any instruments that were used.  Do not try to remove a tick by applying a hot match, petroleum jelly, or fingernail polish to the tick. These methods do not work and may increase the chances of disease being spread from the tick bite.  WHEN SHOULD YOU SEEK MEDICAL CARE? Contact your health care provider if you are unable to remove a tick from your skin or if a part of the tick breaks off and is stuck in the skin.  After a tick bite, you need to be aware of signs and symptoms that could be related to diseases spread by ticks. Contact your health care provider if you develop any of the following in the days or weeks after the tick bite:  Unexplained fever.  Rash. A circular rash that appears days or weeks after the tick bite may indicate the possibility of Lyme disease. The rash may resemble   a target with a bull's-eye and may occur at a different part of your body than the tick bite.  Redness and swelling in the area of the tick bite.   Tender, swollen lymph glands.   Diarrhea.   Weight loss.   Cough.   Fatigue.   Muscle, joint, or bone pain.   Abdominal pain.   Headache.   Lethargy or a change in your level of consciousness.  Difficulty walking or moving your legs.   Numbness in the legs.   Paralysis.  Shortness of breath.   Confusion.   Repeated vomiting.  Document Released: 07/03/2000 Document Revised: 04/26/2013 Document Reviewed: 12/14/2012 [employer] Patient Information 2015 [employer]. This information is  not intended to replace advice given to you by your health care provider. Make sure you discuss any questions you have with your health care provider.  

## 2014-11-21 ENCOUNTER — Telehealth: Payer: Self-pay | Admitting: Family Medicine

## 2014-11-21 NOTE — Telephone Encounter (Signed)
Patient aware that labs are not back and we will call him as soon as we get them in

## 2014-11-23 ENCOUNTER — Other Ambulatory Visit: Payer: Self-pay | Admitting: Family Medicine

## 2014-11-23 LAB — ROCKY MTN SPOTTED FVR ABS PNL(IGG+IGM)
RMSF IgG: NEGATIVE
RMSF IgM: 0.78 index (ref 0.00–0.89)

## 2014-11-23 LAB — LYME AB/WESTERN BLOT REFLEX: Lyme IgG/IgM Ab: <0.91 {ISR} (ref 0.00–0.90)

## 2014-12-04 ENCOUNTER — Other Ambulatory Visit: Payer: Self-pay | Admitting: Family Medicine

## 2015-01-27 ENCOUNTER — Other Ambulatory Visit: Payer: Self-pay | Admitting: Family Medicine

## 2015-01-29 ENCOUNTER — Encounter: Payer: Self-pay | Admitting: Family Medicine

## 2015-01-29 ENCOUNTER — Ambulatory Visit (INDEPENDENT_AMBULATORY_CARE_PROVIDER_SITE_OTHER): Payer: BC Managed Care – PPO | Admitting: Family Medicine

## 2015-01-29 VITALS — BP 131/72 | HR 62 | Temp 97.0°F | Ht 65.0 in | Wt 201.6 lb

## 2015-01-29 DIAGNOSIS — I10 Essential (primary) hypertension: Secondary | ICD-10-CM

## 2015-01-29 DIAGNOSIS — E785 Hyperlipidemia, unspecified: Secondary | ICD-10-CM

## 2015-01-29 DIAGNOSIS — Z23 Encounter for immunization: Secondary | ICD-10-CM | POA: Diagnosis not present

## 2015-01-29 LAB — POCT CBC
GRANULOCYTE PERCENT: 63.1 % (ref 37–80)
HCT, POC: 44.9 % (ref 43.5–53.7)
Hemoglobin: 14.7 g/dL (ref 14.1–18.1)
Lymph, poc: 1.7 (ref 0.6–3.4)
MCH, POC: 29.3 pg (ref 27–31.2)
MCHC: 32.8 g/dL (ref 31.8–35.4)
MCV: 89.3 fL (ref 80–97)
MPV: 8 fL (ref 0–99.8)
POC GRANULOCYTE: 3.3 (ref 2–6.9)
POC LYMPH PERCENT: 31.2 %L (ref 10–50)
Platelet Count, POC: 229 10*3/uL (ref 142–424)
RBC: 5.03 M/uL (ref 4.69–6.13)
RDW, POC: 13.9 %
WBC: 5.3 10*3/uL (ref 4.6–10.2)

## 2015-01-29 MED ORDER — ROSUVASTATIN CALCIUM 20 MG PO TABS: 20.0000 mg | ORAL_TABLET | Freq: Every day | ORAL | Status: DC

## 2015-01-29 NOTE — Patient Instructions (Signed)
Herpes Zoster Virus Vaccine What is this medicine? HERPES ZOSTER VIRUS VACCINE (HUR peez ZOS ter vahy ruhs vak SEEN) is a vaccine. It is used to prevent shingles in adults 61 years old and over. This vaccine is not used to treat shingles or nerve pain from shingles. This medicine may be used for other purposes; ask your health care provider or pharmacist if you have questions. COMMON BRAND NAME(S): Varivax, Zostavax What should I tell my health care provider before I take this medicine? They need to know if you have any of these conditions: -cancer like leukemia or lymphoma -immune system problems or therapy -infection with fever -tuberculosis -an unusual or allergic reaction to vaccines, neomycin, gelatin, other medicines, foods, dyes, or preservatives -pregnant or trying to get pregnant -breast-feeding How should I use this medicine? This vaccine is for injection under the skin. It is given by a health care professional. Talk to your pediatrician regarding the use of this medicine in children. This medicine is not approved for use in children. Overdosage: If you think you have taken too much of this medicine contact a poison control center or emergency room at once. NOTE: This medicine is only for you. Do not share this medicine with others. What if I miss a dose? This does not apply. What may interact with this medicine? Do not take this medicine with any of the following medications: -adalimumab -anakinra -etanercept -infliximab -medicines to treat cancer -medicines that suppress your immune system This medicine may also interact with the following medications: -immunoglobulins -steroid medicines like prednisone or cortisone This list may not describe all possible interactions. Give your health care provider a list of all the medicines, herbs, non-prescription drugs, or dietary supplements you use. Also tell them if you smoke, drink alcohol, or use illegal drugs. Some items may  interact with your medicine. What should I watch for while using this medicine? Visit your doctor for regular check ups. This vaccine, like all vaccines, may not fully protect everyone. After receiving this vaccine it may be possible to pass chickenpox infection to others. Avoid people with immune system problems, pregnant women who have not had chickenpox, and newborns of women who have not had chickenpox. Talk to your doctor for more information. What side effects may I notice from receiving this medicine? Side effects that you should report to your doctor or health care professional as soon as possible: -allergic reactions like skin rash, itching or hives, swelling of the face, lips, or tongue -breathing problems -feeling faint or lightheaded, falls -fever, flu-like symptoms -pain, tingling, numbness in the hands or feet -swelling of the ankles, feet, hands -unusually weak or tired Side effects that usually do not require medical attention (report to your doctor or health care professional if they continue or are bothersome): -aches or pains -chickenpox-like rash -diarrhea -headache -loss of appetite -nausea, vomiting -redness, pain, swelling at site where injected -runny nose This list may not describe all possible side effects. Call your doctor for medical advice about side effects. You may report side effects to FDA at 1-800-FDA-1088. Where should I keep my medicine? This drug is given in a hospital or clinic and will not be stored at home. NOTE: This sheet is a summary. It may not cover all possible information. If you have questions about this medicine, talk to your doctor, pharmacist, or health care provider.  2015, Elsevier/Gold Standard. (2009-12-23 17:43:50)  

## 2015-01-29 NOTE — Progress Notes (Signed)
Subjective:  Patient ID: Nathaniel Kelly, male    DOB: 08/11/1955  Age: 59 y.o. MRN: [id number]  CC: Hypertension; Hyperlipidemia; and Vit D deficiency   HPI Nathaniel Kelly presents for  follow-up of hypertension. Patient has no history of headache chest pain or shortness of breath or recent cough. Patient also denies symptoms of TIA such as numbness weakness lateralizing. Patient checks  blood pressure at home and has not had any elevated readings recently. Patient denies side effects from his medication. States taking it regularly.  Patient also  in for follow-up of elevated cholesterol. Doing well without complaints on current medication. Denies side effects of statin including myalgia and arthralgia and nausea. Also in today for liver function testing. Currently no chest pain, shortness of breath or other cardiovascular related symptoms noted.   History Nathaniel Kelly has a past medical history of Diverticulosis; Hypertension; Hyperlipemia; Colon polyps; and GERD (gastroesophageal reflux disease).   He has past surgical history that includes Hernia repair; Knee arthroscopy; Carpal tunnel release; Flexible sigmoidoscopy (N/A, 11/07/2012); and Hemorrhoid banding (N/A, 11/07/2012).   His family history includes Colon cancer in his paternal aunt and paternal grandmother.He reports that he quit smoking about 28 years ago. He has never used smokeless tobacco. He reports that he drinks alcohol. He reports that he does not use illicit drugs.  Current Outpatient Prescriptions on File Prior to Visit  Medication Sig Dispense Refill  . fluocinonide cream (LIDEX) 0.37 % Apply 1 application topically 2 (two) times daily. 120 g 3  . lisinopril (PRINIVIL,ZESTRIL) 20 MG tablet Take 1 tablet (20 mg total) by mouth daily. Please take as directed 90 tablet 3  . omeprazole (PRILOSEC) 40 MG capsule TAKE 1 CAPSULE DAILY 90 capsule 1  . Vitamin D, Ergocalciferol, (DRISDOL) 50000 UNITS CAPS capsule TAKE 1 CAPSULE TWICE WEEKLY  16 capsule 0  . aspirin 81 MG chewable tablet Chew 81 mg by mouth daily.    . Glucosamine-Chondroitin (OSTEO BI-FLEX REGULAR STRENGTH PO) Take 2 capsules by mouth daily.     No current facility-administered medications on file prior to visit.    ROS Review of Systems  Constitutional: Negative for fever, chills and diaphoresis.  HENT: Negative for congestion, rhinorrhea and sore throat.   Respiratory: Negative for cough, shortness of breath and wheezing.   Cardiovascular: Negative for chest pain.  Gastrointestinal: Negative for nausea, vomiting, abdominal pain, diarrhea, constipation and abdominal distention.  Genitourinary: Negative for dysuria and frequency.  Musculoskeletal: Negative for joint swelling and arthralgias.  Skin: Negative for rash.  Neurological: Negative for headaches.    Objective:  BP 131/72 mmHg  Pulse 62  Temp(Src) 97 F (36.1 C) (Oral)  Ht 5' 5"  (1.651 m)  Wt 201 lb 9.6 oz (91.445 kg)  BMI 33.55 kg/m2  BP Readings from Last 3 Encounters:  01/29/15 131/72  11/20/14 122/75  10/26/14 130/82    Wt Readings from Last 3 Encounters:  01/29/15 201 lb 9.6 oz (91.445 kg)  11/20/14 198 lb (89.812 kg)  10/26/14 202 lb (91.627 kg)     Physical Exam  Constitutional: He is oriented to person, place, and time. He appears well-developed and well-nourished. No distress.  HENT:  Head: Normocephalic and atraumatic.  Right Ear: External ear normal.  Left Ear: External ear normal.  Nose: Nose normal.  Mouth/Throat: Oropharynx is clear and moist.  Eyes: Conjunctivae and EOM are normal. Pupils are equal, round, and reactive to light.  Neck: Normal range of motion. Neck supple. No thyromegaly present.  Cardiovascular:  Normal rate, regular rhythm and normal heart sounds.   No murmur heard. Pulmonary/Chest: Effort normal and breath sounds normal. No respiratory distress. He has no wheezes. He has no rales.  Abdominal: Soft. Bowel sounds are normal. He exhibits no  distension. There is no tenderness.  Lymphadenopathy:    He has no cervical adenopathy.  Neurological: He is alert and oriented to person, place, and time. He has normal reflexes.  Skin: Skin is warm and dry.  Psychiatric: He has a normal mood and affect. His behavior is normal. Judgment and thought content normal.    No results found for: HGBA1C  Lab Results  Component Value Date   WBC 5.3 01/29/2015   HGB 14.7 01/29/2015   HCT 44.9 01/29/2015   GLUCOSE 92 01/29/2015   CHOL 242* 01/29/2015   TRIG 83 01/29/2015   HDL 65 01/29/2015   LDLCALC 160* 01/29/2015   ALT 16 01/29/2015   AST 25 01/29/2015   NA 139 01/29/2015   K 4.3 01/29/2015   CL 99 01/29/2015   CREATININE 0.98 01/29/2015   BUN 14 01/29/2015   CO2 24 01/29/2015   TSH 1.430 10/26/2014   PSA 0.9 10/26/2014    No results found.  Assessment & Plan:   Nathaniel Kelly was seen today for hypertension, hyperlipidemia and vit d deficiency.  Diagnoses and all orders for this visit:  Hyperlipemia Orders: -     Lipid panel; Standing -     Lipid panel -     CMP14+EGFR; Future -     Lipid panel; Future  Essential hypertension Orders: -     POCT CBC; Standing -     CMP14+EGFR; Standing -     POCT CBC -     CMP14+EGFR -     CMP14+EGFR; Future -     Lipid panel; Future  Other orders -     Varicella-zoster vaccine subcutaneous -     rosuvastatin (CRESTOR) 20 MG tablet; Take 1 tablet (20 mg total) by mouth daily.   I have discontinued Nathaniel Kelly atorvastatin and doxycycline. I am also having him start on rosuvastatin. Additionally, I am having him maintain his aspirin, Glucosamine-Chondroitin (OSTEO BI-FLEX REGULAR STRENGTH PO), fluocinonide cream, lisinopril, omeprazole, and Vitamin D (Ergocalciferol).  Meds ordered this encounter  Medications  . rosuvastatin (CRESTOR) 20 MG tablet    Sig: Take 1 tablet (20 mg total) by mouth daily.    Dispense:  90 tablet    Refill:  3     Follow-up: Return in about 3 months  (around 05/01/2015).  Claretta Fraise, M.D.

## 2015-01-30 LAB — CMP14+EGFR
ALBUMIN: 4.1 g/dL (ref 3.6–4.8)
ALT: 16 IU/L (ref 0–44)
AST: 25 IU/L (ref 0–40)
Albumin/Globulin Ratio: 1.7 (ref 1.1–2.5)
Alkaline Phosphatase: 77 IU/L (ref 39–117)
BUN / CREAT RATIO: 14 (ref 10–22)
BUN: 14 mg/dL (ref 8–27)
Bilirubin Total: 1.2 mg/dL (ref 0.0–1.2)
CHLORIDE: 99 mmol/L (ref 97–108)
CO2: 24 mmol/L (ref 18–29)
Calcium: 9.9 mg/dL (ref 8.6–10.2)
Creatinine, Ser: 0.98 mg/dL (ref 0.76–1.27)
GFR calc Af Amer: 96 mL/min/{1.73_m2} (ref >59)
GFR calc non Af Amer: 83 mL/min/{1.73_m2} (ref >59)
Globulin, Total: 2.4 g/dL (ref 1.5–4.5)
Glucose: 92 mg/dL (ref 65–99)
POTASSIUM: 4.3 mmol/L (ref 3.5–5.2)
SODIUM: 139 mmol/L (ref 134–144)
TOTAL PROTEIN: 6.5 g/dL (ref 6.0–8.5)

## 2015-01-30 LAB — LIPID PANEL
CHOLESTEROL TOTAL: 242 mg/dL — AB (ref 100–199)
Chol/HDL Ratio: 3.7 ratio units (ref 0.0–5.0)
HDL: 65 mg/dL (ref >39)
LDL CALC: 160 mg/dL — AB (ref 0–99)
TRIGLYCERIDES: 83 mg/dL (ref 0–149)
VLDL Cholesterol Cal: 17 mg/dL (ref 5–40)

## 2015-01-31 ENCOUNTER — Telehealth: Payer: Self-pay | Admitting: *Deleted

## 2015-01-31 NOTE — Telephone Encounter (Signed)
-----   Message from Claretta Fraise, MD sent at 01/30/2015  1:50 PM EDT ----- Your cholesterol is too high. It would benefit from medication. Without treatment, risk for heart attack, stroke and other medical conditions is high. I recommend atorvastatin. 40 mg daily with supper should reduce your risk significantly.  Nurse: please send in a prescription to pt.s pharmacy, if they are willing, for atorvastatin 40 mg. One daily.  6 mos supply.  Make sure they are set up for a followup in 6 mos. Ask them to come 2 days early for fasting NMR and CMP.

## 2015-03-24 ENCOUNTER — Other Ambulatory Visit: Payer: Self-pay | Admitting: Family Medicine

## 2015-04-01 ENCOUNTER — Ambulatory Visit (INDEPENDENT_AMBULATORY_CARE_PROVIDER_SITE_OTHER): Payer: BC Managed Care – PPO | Admitting: Family Medicine

## 2015-04-01 ENCOUNTER — Encounter: Payer: Self-pay | Admitting: Family Medicine

## 2015-04-01 VITALS — BP 112/72 | HR 58 | Temp 97.8°F | Ht 65.0 in | Wt 202.0 lb

## 2015-04-01 DIAGNOSIS — K921 Melena: Secondary | ICD-10-CM

## 2015-04-01 DIAGNOSIS — K648 Other hemorrhoids: Secondary | ICD-10-CM

## 2015-04-01 DIAGNOSIS — K5732 Diverticulitis of large intestine without perforation or abscess without bleeding: Secondary | ICD-10-CM

## 2015-04-01 MED ORDER — HYDROCORTISONE ACETATE 25 MG RE SUPP: 25.0000 mg | Freq: Four times a day (QID) | RECTAL | Status: DC

## 2015-04-01 MED ORDER — OMEPRAZOLE 40 MG PO CPDR: 40.0000 mg | DELAYED_RELEASE_CAPSULE | Freq: Every day | ORAL | Status: DC

## 2015-04-01 MED ORDER — CIPROFLOXACIN HCL 500 MG PO TABS: 500.0000 mg | ORAL_TABLET | Freq: Two times a day (BID) | ORAL | Status: DC

## 2015-04-01 MED ORDER — PSYLLIUM 28 % PO PACK: PACK | ORAL | Status: DC

## 2015-04-01 NOTE — Progress Notes (Signed)
Subjective:  Patient ID: Nathaniel Kelly, male    DOB: [birth year]  Age: 59 y.o. MRN: [id number]  CC: Hematochezia   HPI Nathaniel Kelly presents for 2-3 weeks of recurrent red bloody discharge from rectum at each bowel movement. Some red noted in showering or on his underwear as well. He's had some hemorrhoids banded about a year ago. Patient is very concerned because he had some polyps in the past however he had a normal colonoscopy 5 years ago and was told he could wait 10 years before his next. He notes that his mother and grandmother both had cancer of the colon. Last year he had some hemorrhoids banded. He is also having some lower abdominal pain in the suprapubic area centrally. He has a history of diverticulosis but this is not left lower quadrant.  History Nathaniel Kelly has a past medical history of Diverticulosis; Hypertension; Hyperlipemia; Colon polyps; and GERD (gastroesophageal reflux disease).   He has past surgical history that includes Hernia repair; Knee arthroscopy; Carpal tunnel release; Flexible sigmoidoscopy (N/A, 11/07/2012); and Hemorrhoid banding (N/A, 11/07/2012).   His family history includes Colon cancer in his paternal aunt and paternal grandmother.He reports that he quit smoking about 28 years ago. He has never used smokeless tobacco. He reports that he drinks alcohol. He reports that he does not use illicit drugs.  Outpatient Prescriptions Prior to Visit  Medication Sig Dispense Refill  . aspirin 81 MG chewable tablet Chew 81 mg by mouth daily.    . fluocinonide cream (LIDEX) 2.09 % Apply 1 application topically 2 (two) times daily. 120 g 3  . lisinopril (PRINIVIL,ZESTRIL) 20 MG tablet Take 1 tablet (20 mg total) by mouth daily. Please take as directed 90 tablet 3  . omeprazole (PRILOSEC) 40 MG capsule TAKE 1 CAPSULE DAILY 90 capsule 1  . rosuvastatin (CRESTOR) 20 MG tablet Take 1 tablet (20 mg total) by mouth daily. (Patient not taking: Reported on 04/01/2015) 90 tablet 3  .  Glucosamine-Chondroitin (OSTEO BI-FLEX REGULAR STRENGTH PO) Take 2 capsules by mouth daily.    . Vitamin D, Ergocalciferol, (DRISDOL) 50000 UNITS CAPS capsule TAKE 1 CAPSULE TWICE WEEKLY (Patient not taking: Reported on 04/01/2015) 16 capsule 0   No facility-administered medications prior to visit.    ROS Review of Systems  Constitutional: Negative for fever, chills, diaphoresis, activity change (decreased) and fatigue.  HENT: Negative for congestion and rhinorrhea.   Respiratory: Negative for cough and shortness of breath.   Cardiovascular: Negative for chest pain and palpitations.  Gastrointestinal: Positive for abdominal pain (suprapubic ache off and on getting worse now moderate) and abdominal distention. Negative for nausea, vomiting, diarrhea, constipation and blood in stool.  Genitourinary: Negative for dysuria, frequency, hematuria and flank pain.  Musculoskeletal: Negative for myalgias, joint swelling and arthralgias.  Skin: Negative for rash and wound.  Neurological: Negative for dizziness and weakness.  Psychiatric/Behavioral: The patient is not nervous/anxious.   All other systems reviewed and are negative.   Objective:  BP 112/72 mmHg  Pulse 58  Temp(Src) 97.8 F (36.6 C) (Oral)  Ht 5\' 5"  (1.651 m)  Wt 202 lb (91.627 kg)  BMI 33.61 kg/m2  BP Readings from Last 3 Encounters:  04/01/15 112/72  01/29/15 131/72  11/20/14 122/75    Wt Readings from Last 3 Encounters:  04/01/15 202 lb (91.627 kg)  01/29/15 201 lb 9.6 oz (91.445 kg)  11/20/14 198 lb (89.812 kg)     Physical Exam  No results found for: HGBA1C  Lab Results  Component Value Date   WBC 5.3 01/29/2015   HGB 14.7 01/29/2015   HCT 44.9 01/29/2015   GLUCOSE 92 01/29/2015   CHOL 242* 01/29/2015   TRIG 83 01/29/2015   HDL 65 01/29/2015   LDLCALC 160* 01/29/2015   ALT 16 01/29/2015   AST 25 01/29/2015   NA 139 01/29/2015   K 4.3 01/29/2015   CL 99 01/29/2015   CREATININE 0.98 01/29/2015   BUN  14 01/29/2015   CO2 24 01/29/2015   TSH 1.430 10/26/2014   PSA 0.9 10/26/2014    No results found.  Assessment & Plan:   Nathaniel Kelly was seen today for hematochezia.  Diagnoses and all orders for this visit:  Diverticulitis of colon  Hematochezia -     Ambulatory referral to Gastroenterology  Internal hemorrhoid  Other orders -     omeprazole (PRILOSEC) 40 MG capsule; Take 1 capsule (40 mg total) by mouth daily. -     Discontinue: hydrocortisone (ANUSOL-HC) 25 MG suppository; Place 1 suppository (25 mg total) rectally 4 (four) times daily. -     Discontinue: ciprofloxacin (CIPRO) 500 MG tablet; Take 1 tablet (500 mg total) by mouth 2 (two) times daily. -     psyllium (METAMUCIL SMOOTH TEXTURE) 28 % packet; 1 tablespoon in juice twice daily. -     ciprofloxacin (CIPRO) 500 MG tablet; Take 1 tablet (500 mg total) by mouth 2 (two) times daily. -     hydrocortisone (ANUSOL-HC) 25 MG suppository; Place 1 suppository (25 mg total) rectally 4 (four) times daily.   I have discontinued Nathaniel Kelly Glucosamine-Chondroitin (OSTEO BI-FLEX REGULAR STRENGTH PO) and Vitamin D (Ergocalciferol). I have also changed his omeprazole. Additionally, I am having him start on psyllium. Lastly, I am having him maintain his aspirin, fluocinonide cream, lisinopril, rosuvastatin, ciprofloxacin, and hydrocortisone.  Meds ordered this encounter  Medications  . omeprazole (PRILOSEC) 40 MG capsule    Sig: Take 1 capsule (40 mg total) by mouth daily.    Dispense:  90 capsule    Refill:  4  . DISCONTD: hydrocortisone (ANUSOL-HC) 25 MG suppository    Sig: Place 1 suppository (25 mg total) rectally 4 (four) times daily.    Dispense:  56 suppository    Refill:  5  . DISCONTD: ciprofloxacin (CIPRO) 500 MG tablet    Sig: Take 1 tablet (500 mg total) by mouth 2 (two) times daily.    Dispense:  20 tablet    Refill:  0  . psyllium (METAMUCIL SMOOTH TEXTURE) 28 % packet    Sig: 1 tablespoon in juice twice daily.     Dispense:  500 packet    Refill:  11  . ciprofloxacin (CIPRO) 500 MG tablet    Sig: Take 1 tablet (500 mg total) by mouth 2 (two) times daily.    Dispense:  20 tablet    Refill:  0  . hydrocortisone (ANUSOL-HC) 25 MG suppository    Sig: Place 1 suppository (25 mg total) rectally 4 (four) times daily.    Dispense:  56 suppository    Refill:  5     Follow-up: Return in about 2 weeks (around 04/15/2015), or if symptoms worsen or fail to improve.  Claretta Fraise, M.D.

## 2015-04-01 NOTE — Patient Instructions (Signed)
If symptoms do not resolve within 2 weeks, we will arrange for colonoscopy for you. Please notify the office if bleeding or pain continued.

## 2015-05-10 ENCOUNTER — Ambulatory Visit (INDEPENDENT_AMBULATORY_CARE_PROVIDER_SITE_OTHER): Payer: BC Managed Care – PPO

## 2015-05-10 DIAGNOSIS — Z23 Encounter for immunization: Secondary | ICD-10-CM | POA: Diagnosis not present

## 2015-10-08 ENCOUNTER — Other Ambulatory Visit: Payer: Self-pay | Admitting: Family Medicine

## 2015-10-08 NOTE — Telephone Encounter (Signed)
Please forward to the provider that you cc the patient. I have never seen this patient.

## 2015-10-08 NOTE — Telephone Encounter (Signed)
Last seen 04/01/15 Dr Livia Snellen  Dr Laurance Flatten PCP  Requesting 90 day supply

## 2015-10-09 NOTE — Telephone Encounter (Signed)
Please forward this to his primary care provider

## 2015-11-04 ENCOUNTER — Telehealth: Payer: Self-pay | Admitting: Family Medicine

## 2015-11-04 DIAGNOSIS — K219 Gastro-esophageal reflux disease without esophagitis: Secondary | ICD-10-CM

## 2015-11-04 DIAGNOSIS — E785 Hyperlipidemia, unspecified: Secondary | ICD-10-CM

## 2015-11-04 NOTE — Telephone Encounter (Signed)
Which labs would you like ordered?

## 2015-11-04 NOTE — Telephone Encounter (Signed)
Labs ordered.

## 2015-11-05 ENCOUNTER — Other Ambulatory Visit: Payer: BC Managed Care – PPO

## 2015-11-05 DIAGNOSIS — E785 Hyperlipidemia, unspecified: Secondary | ICD-10-CM

## 2015-11-05 DIAGNOSIS — I10 Essential (primary) hypertension: Secondary | ICD-10-CM

## 2015-11-05 NOTE — Telephone Encounter (Signed)
Left detailed message stating he can come in and have labs drawn at any time since they have been ordered and to CB with any questions or concerns.

## 2015-11-06 LAB — CMP14+EGFR
ALT: 11 IU/L (ref 0–44)
AST: 24 IU/L (ref 0–40)
Albumin/Globulin Ratio: 1.6 (ref 1.2–2.2)
Albumin: 4.2 g/dL (ref 3.6–4.8)
Alkaline Phosphatase: 70 IU/L (ref 39–117)
BUN/Creatinine Ratio: 14 (ref 10–24)
BUN: 14 mg/dL (ref 8–27)
Bilirubin Total: 1.2 mg/dL (ref 0.0–1.2)
CALCIUM: 9.4 mg/dL (ref 8.6–10.2)
CO2: 23 mmol/L (ref 18–29)
Chloride: 98 mmol/L (ref 96–106)
Creatinine, Ser: 0.98 mg/dL (ref 0.76–1.27)
GFR calc Af Amer: 95 mL/min/{1.73_m2} (ref >59)
GFR, EST NON AFRICAN AMERICAN: 82 mL/min/{1.73_m2} (ref >59)
GLOBULIN, TOTAL: 2.6 g/dL (ref 1.5–4.5)
Glucose: 93 mg/dL (ref 65–99)
Potassium: 4.3 mmol/L (ref 3.5–5.2)
Sodium: 137 mmol/L (ref 134–144)
TOTAL PROTEIN: 6.8 g/dL (ref 6.0–8.5)

## 2015-11-06 LAB — CBC WITH DIFFERENTIAL/PLATELET
BASOS ABS: 0 10*3/uL (ref 0.0–0.2)
Basos: 1 %
EOS (ABSOLUTE): 0.1 10*3/uL (ref 0.0–0.4)
Eos: 2 %
HEMOGLOBIN: 14.8 g/dL (ref 12.6–17.7)
Hematocrit: 44.7 % (ref 37.5–51.0)
Immature Grans (Abs): 0 10*3/uL (ref 0.0–0.1)
Immature Granulocytes: 0 %
LYMPHS ABS: 1.6 10*3/uL (ref 0.7–3.1)
Lymphs: 31 %
MCH: 30.7 pg (ref 26.6–33.0)
MCHC: 33.1 g/dL (ref 31.5–35.7)
MCV: 93 fL (ref 79–97)
MONOS ABS: 0.4 10*3/uL (ref 0.1–0.9)
Monocytes: 8 %
NEUTROS ABS: 3 10*3/uL (ref 1.4–7.0)
Neutrophils: 58 %
PLATELETS: 237 10*3/uL (ref 150–379)
RBC: 4.82 x10E6/uL (ref 4.14–5.80)
RDW: 13.7 % (ref 12.3–15.4)
WBC: 5.1 10*3/uL (ref 3.4–10.8)

## 2015-11-06 LAB — LIPID PANEL
CHOL/HDL RATIO: 4.2 ratio (ref 0.0–5.0)
Cholesterol, Total: 246 mg/dL — ABNORMAL HIGH (ref 100–199)
HDL: 59 mg/dL (ref >39)
LDL CALC: 164 mg/dL — AB (ref 0–99)
TRIGLYCERIDES: 114 mg/dL (ref 0–149)
VLDL Cholesterol Cal: 23 mg/dL (ref 5–40)

## 2015-11-11 ENCOUNTER — Encounter: Payer: Self-pay | Admitting: Family Medicine

## 2015-11-11 ENCOUNTER — Ambulatory Visit (INDEPENDENT_AMBULATORY_CARE_PROVIDER_SITE_OTHER): Payer: BC Managed Care – PPO | Admitting: Family Medicine

## 2015-11-11 VITALS — BP 110/63 | HR 64 | Temp 97.3°F | Ht 65.0 in | Wt 209.8 lb

## 2015-11-11 DIAGNOSIS — G720 Drug-induced myopathy: Secondary | ICD-10-CM

## 2015-11-11 DIAGNOSIS — T466X5A Adverse effect of antihyperlipidemic and antiarteriosclerotic drugs, initial encounter: Secondary | ICD-10-CM

## 2015-11-11 DIAGNOSIS — E785 Hyperlipidemia, unspecified: Secondary | ICD-10-CM

## 2015-11-11 MED ORDER — ROSUVASTATIN CALCIUM 5 MG PO TABS: 20.0000 mg | ORAL_TABLET | Freq: Every day | ORAL | Status: DC

## 2015-11-11 NOTE — Progress Notes (Signed)
Subjective:  Patient ID: Nesta Cude, male    DOB: 1956/01/05  Age: 60 y.o. MRN: [id number]  CC: Hyperlipidemia   HPI Dannel Haraldson presents for using statin sporadically due to intermittent joint pain in back and shoulder. Osteo biflex caused diarrhea. Did relieve pain though. States pain resolves off statin. Returns within a few days so he DCs. Then restarts when pain resolves   History Cliford has a past medical history of Diverticulosis; Hypertension; Hyperlipemia; Colon polyps; and GERD (gastroesophageal reflux disease).   He has past surgical history that includes Hernia repair; Knee arthroscopy; Carpal tunnel release; Flexible sigmoidoscopy (N/A, 11/07/2012); and Hemorrhoid banding (N/A, 11/07/2012).   His family history includes Colon cancer in his paternal aunt and paternal grandmother; Heart disease in his maternal grandfather and paternal grandfather.He reports that he quit smoking about 29 years ago. He has never used smokeless tobacco. He reports that he drinks alcohol. He reports that he does not use illicit drugs.    ROS Review of Systems  Constitutional: Negative for fever, chills and diaphoresis.  HENT: Negative for rhinorrhea and sore throat.   Respiratory: Negative for cough and shortness of breath.   Cardiovascular: Negative for chest pain.  Gastrointestinal: Negative for abdominal pain.  Musculoskeletal: Positive for myalgias, back pain (Hx scoliosis) and arthralgias (only while taking statin).  Skin: Negative for rash.  Neurological: Negative for weakness and headaches.    Objective:  BP 110/63 mmHg  Pulse 64  Temp(Src) 97.3 F (36.3 C) (Oral)  Ht 5\' 5"  (1.651 m)  Wt 209 lb 12.8 oz (95.165 kg)  BMI 34.91 kg/m2  SpO2 98%  BP Readings from Last 3 Encounters:  11/11/15 110/63  04/01/15 112/72  01/29/15 131/72    Wt Readings from Last 3 Encounters:  11/11/15 209 lb 12.8 oz (95.165 kg)  04/01/15 202 lb (91.627 kg)  01/29/15 201 lb 9.6 oz (91.445 kg)      Physical Exam   Lab Results  Component Value Date   WBC 5.1 11/05/2015   HGB 14.7 01/29/2015   HCT 44.7 11/05/2015   PLT 237 11/05/2015   GLUCOSE 93 11/05/2015   CHOL 246* 11/05/2015   TRIG 114 11/05/2015   HDL 59 11/05/2015   LDLCALC 164* 11/05/2015   ALT 11 11/05/2015   AST 24 11/05/2015   NA 137 11/05/2015   K 4.3 11/05/2015   CL 98 11/05/2015   CREATININE 0.98 11/05/2015   BUN 14 11/05/2015   CO2 23 11/05/2015   TSH 1.430 10/26/2014   PSA 0.9 10/26/2014    No results found.  Assessment & Plan:   Shayn was seen today for hyperlipidemia.  Diagnoses and all orders for this visit:  Hyperlipemia  Statin myopathy  Other orders -     rosuvastatin (CRESTOR) 5 MG tablet; Take 4 tablets (20 mg total) by mouth daily.    Trial of low dose statin daily.If sx continue, reassess risk factors and trial of non-statin therapy  I have changed Mr. Hertzog rosuvastatin. I am also having him maintain his aspirin, fluocinonide cream, omeprazole, psyllium, hydrocortisone, lisinopril, Co Q-10, and Turmeric.  Meds ordered this encounter  Medications  . Coenzyme Q10 (CO Q-10) 400 MG CAPS    Sig: Take 1 capsule by mouth daily.  . Turmeric 450 MG CAPS    Sig: Take 1 tablet by mouth daily.  . rosuvastatin (CRESTOR) 5 MG tablet    Sig: Take 4 tablets (20 mg total) by mouth daily.    Dispense:  30  tablet    Refill:  5     Follow-up: Return in about 6 weeks (around 12/23/2015) for CPE.  Claretta Fraise, M.D.

## 2015-12-24 ENCOUNTER — Telehealth: Payer: Self-pay | Admitting: Family Medicine

## 2015-12-25 ENCOUNTER — Other Ambulatory Visit: Payer: Self-pay | Admitting: *Deleted

## 2015-12-25 MED ORDER — LISINOPRIL 20 MG PO TABS: ORAL_TABLET | ORAL | Status: DC

## 2015-12-25 NOTE — Progress Notes (Signed)
rx sent into pharmacy per pt request Okayed per Dr Livia Snellen

## 2015-12-25 NOTE — Telephone Encounter (Signed)
RX sent into [facility] Okayed per Dr Livia Snellen Left message for pt

## 2016-01-06 ENCOUNTER — Telehealth: Payer: Self-pay | Admitting: Family Medicine

## 2016-01-06 NOTE — Telephone Encounter (Signed)
Appt given for tomorrow

## 2016-01-07 ENCOUNTER — Ambulatory Visit (INDEPENDENT_AMBULATORY_CARE_PROVIDER_SITE_OTHER): Payer: BC Managed Care – PPO | Admitting: Family

## 2016-01-07 ENCOUNTER — Encounter: Payer: Self-pay | Admitting: Family

## 2016-01-07 VITALS — BP 140/88 | HR 53 | Temp 96.7°F | Ht 65.0 in | Wt 206.8 lb

## 2016-01-07 DIAGNOSIS — J209 Acute bronchitis, unspecified: Secondary | ICD-10-CM | POA: Diagnosis not present

## 2016-01-07 DIAGNOSIS — E669 Obesity, unspecified: Secondary | ICD-10-CM | POA: Diagnosis not present

## 2016-01-07 MED ORDER — FLUTICASONE PROPIONATE 50 MCG/ACT NA SUSP: 2.0000 | Freq: Every day | NASAL | Status: DC

## 2016-01-07 MED ORDER — AZITHROMYCIN 250 MG PO TABS: ORAL_TABLET | ORAL | Status: DC

## 2016-01-07 MED ORDER — PREDNISONE 10 MG (21) PO TBPK: 10.0000 mg | ORAL_TABLET | Freq: Every day | ORAL | Status: DC

## 2016-01-07 MED ORDER — HYDROCODONE-HOMATROPINE 5-1.5 MG/5ML PO SYRP: 5.0000 mL | ORAL_SOLUTION | Freq: Three times a day (TID) | ORAL | Status: DC | PRN

## 2016-01-07 MED ORDER — BENZONATATE 200 MG PO CAPS: 200.0000 mg | ORAL_CAPSULE | Freq: Three times a day (TID) | ORAL | Status: DC | PRN

## 2016-01-07 NOTE — Patient Instructions (Signed)
Acute Bronchitis Bronchitis is inflammation of the airways that extend from the windpipe into the lungs (bronchi). The inflammation often causes mucus to develop. This leads to a cough, which is the most common symptom of bronchitis.  In acute bronchitis, the condition usually develops suddenly and goes away over time, usually in a couple weeks. Smoking, allergies, and asthma can make bronchitis worse. Repeated episodes of bronchitis may cause further lung problems.  CAUSES Acute bronchitis is most often caused by the same virus that causes a cold. The virus can spread from person to person (contagious) through coughing, sneezing, and touching contaminated objects. SIGNS AND SYMPTOMS   Cough.   Fever.   Coughing up mucus.   Body aches.   Chest congestion.   Chills.   Shortness of breath.   Sore throat.  DIAGNOSIS  Acute bronchitis is usually diagnosed through a physical exam. Your health care provider will also ask you questions about your medical history. Tests, such as chest X-rays, are sometimes done to rule out other conditions.  TREATMENT  Acute bronchitis usually goes away in a couple weeks. Oftentimes, no medical treatment is necessary. Medicines are sometimes given for relief of fever or cough. Antibiotic medicines are usually not needed but may be prescribed in certain situations. In some cases, an inhaler may be recommended to help reduce shortness of breath and control the cough. A cool mist vaporizer may also be used to help thin bronchial secretions and make it easier to clear the chest.  HOME CARE INSTRUCTIONS  Get plenty of rest.   Drink enough fluids to keep your urine clear or pale yellow (unless you have a medical condition that requires fluid restriction). Increasing fluids may help thin your respiratory secretions (sputum) and reduce chest congestion, and it will prevent dehydration.   Take medicines only as directed by your health care provider.  If  you were prescribed an antibiotic medicine, finish it all even if you start to feel better.  Avoid smoking and secondhand smoke. Exposure to cigarette smoke or irritating chemicals will make bronchitis worse. If you are a smoker, consider using nicotine gum or skin patches to help control withdrawal symptoms. Quitting smoking will help your lungs heal faster.   Reduce the chances of another bout of acute bronchitis by washing your hands frequently, avoiding people with cold symptoms, and trying not to touch your hands to your mouth, nose, or eyes.   Keep all follow-up visits as directed by your health care provider.  SEEK MEDICAL CARE IF: Your symptoms do not improve after 1 week of treatment.  SEEK IMMEDIATE MEDICAL CARE IF:  You develop an increased fever or chills.   You have chest pain.   You have severe shortness of breath.  You have bloody sputum.   You develop dehydration.  You faint or repeatedly feel like you are going to pass out.  You develop repeated vomiting.  You develop a severe headache. MAKE SURE YOU:   Understand these instructions.  Will watch your condition.  Will get help right away if you are not doing well or get worse.   This information is not intended to replace advice given to you by your health care provider. Make sure you discuss any questions you have with your health care provider.   Document Released: 08/13/2004 Document Revised: 07/27/2014 Document Reviewed: 12/27/2012 Elsevier Interactive Patient Education 2016 [employer]  - Take meds as prescribed - Use a cool mist humidifier  -Use saline nose sprays frequently -Saline   irrigations of the nose can be very helpful if done frequently.  * 4X daily for 1 week*  * Use of a nettie pot can be helpful with this. Follow directions with this* -Force fluids -For any cough or congestion  Use plain Mucinex- regular strength or max strength is fine   * Children- consult with Pharmacist for  dosing -For fever or aces or pains- take tylenol or ibuprofen appropriate for age and weight.  * for fevers greater than 101 orally you may alternate ibuprofen and tylenol every  3 hours. -Throat lozenges if help    Arkie Tagliaferro, FNP  

## 2016-01-07 NOTE — Progress Notes (Signed)
Subjective:    Patient ID: Nathaniel Kelly, male    DOB: 09/28/1955, 60 y.o.   MRN: [id number]  Cough This is a new problem. The current episode started more than 1 month ago. The problem has been waxing and waning. The problem occurs every few minutes. The cough is productive of sputum. Associated symptoms include headaches, myalgias, nasal congestion, postnasal drip, rhinorrhea, a sore throat and wheezing. Pertinent negatives include no chills, ear congestion, ear pain or fever. The symptoms are aggravated by lying down. He has tried rest and OTC cough suppressant for the symptoms. The treatment provided mild relief. There is no history of asthma or COPD.      Review of Systems  Constitutional: Negative.  Negative for fever and chills.  HENT: Positive for postnasal drip, rhinorrhea and sore throat. Negative for ear pain.   Respiratory: Positive for cough and wheezing.   Cardiovascular: Negative.   Gastrointestinal: Negative.   Endocrine: Negative.   Genitourinary: Negative.   Musculoskeletal: Positive for myalgias.  Neurological: Positive for headaches.  Hematological: Negative.   Psychiatric/Behavioral: Negative.   All other systems reviewed and are negative.      Objective:   Physical Exam  Constitutional: He is oriented to person, place, and time. He appears well-developed and well-nourished. No distress.  HENT:  Head: Normocephalic.  Right Ear: External ear normal.  Left Ear: External ear normal.  Mouth/Throat: Oropharynx is clear and moist.  Nasal passage erythemas with mild swelling    Eyes: Pupils are equal, round, and reactive to light. Right eye exhibits no discharge. Left eye exhibits no discharge.  Neck: Normal range of motion. Neck supple. No thyromegaly present.  Cardiovascular: Normal rate, regular rhythm, normal heart sounds and intact distal pulses.   No murmur heard. Pulmonary/Chest: Effort normal and breath sounds normal. No respiratory distress. He has no  wheezes.  Nonproductive cough   Abdominal: Soft. Bowel sounds are normal. He exhibits no distension. There is no tenderness.  Musculoskeletal: Normal range of motion. He exhibits no edema or tenderness.  Neurological: He is alert and oriented to person, place, and time.  Skin: Skin is warm and dry. No rash noted. No erythema.  Psychiatric: He has a normal mood and affect. His behavior is normal. Judgment and thought content normal.  Vitals reviewed.     BP 140/88 mmHg  Pulse 53  Temp(Src) 96.7 F (35.9 C) (Oral)  Ht 5\' 5"  (1.651 m)  Wt 206 lb 12.8 oz (93.804 kg)  BMI 34.41 kg/m2     Assessment & Plan:  1. Acute bronchitis, unspecified organism -- Take meds as prescribed - Use a cool mist humidifier  -Use saline nose sprays frequently -Saline irrigations of the nose can be very helpful if done frequently.  * 4X daily for 1 week*  * Use of a nettie pot can be helpful with this. Follow directions with this* -Force fluids -For any cough or congestion  Use plain Mucinex- regular strength or max strength is fine   * Children- consult with Pharmacist for dosing -For fever or aces or pains- take tylenol or ibuprofen appropriate for age and weight.  * for fevers greater than 101 orally you may alternate ibuprofen and tylenol every  3 hours. -Throat lozenges if help - azithromycin (ZITHROMAX Z-PAK) 250 MG tablet; As directed  Dispense: 1 each; Refill: 0 - predniSONE (STERAPRED UNI-PAK 21 TAB) 10 MG (21) TBPK tablet; Take 1 tablet (10 mg total) by mouth daily. As directed x 6 days  Dispense: 21 tablet; Refill: 0 - HYDROcodone-homatropine (HYCODAN) 5-1.5 MG/5ML syrup; Take 5 mLs by mouth every 8 (eight) hours as needed for cough.  Dispense: 120 mL; Refill: 0 - benzonatate (TESSALON) 200 MG capsule; Take 1 capsule (200 mg total) by mouth 3 (three) times daily as needed.  Dispense: 30 capsule; Refill: 1 - fluticasone (FLONASE) 50 MCG/ACT nasal spray; Place 2 sprays into both nostrils  daily.  Dispense: 16 g; Refill: 6  2. Obesity (BMI 30-39.9)   Evelina Dun, FNP

## 2016-03-08 ENCOUNTER — Other Ambulatory Visit: Payer: Self-pay | Admitting: Family Medicine

## 2016-03-09 ENCOUNTER — Telehealth: Payer: Self-pay | Admitting: Family Medicine

## 2016-06-22 ENCOUNTER — Telehealth: Payer: Self-pay | Admitting: Family Medicine

## 2016-06-22 MED ORDER — LISINOPRIL 20 MG PO TABS: 20.0000 mg | ORAL_TABLET | Freq: Every day | ORAL | 0 refills | Status: DC

## 2016-06-22 NOTE — Telephone Encounter (Signed)
done

## 2016-07-07 ENCOUNTER — Telehealth: Payer: Self-pay | Admitting: Family Medicine

## 2016-07-07 ENCOUNTER — Other Ambulatory Visit: Payer: Self-pay | Admitting: *Deleted

## 2016-07-07 DIAGNOSIS — E7849 Other hyperlipidemia: Secondary | ICD-10-CM

## 2016-07-07 DIAGNOSIS — Z0000 Encounter for general adult medical examination without abnormal findings: Secondary | ICD-10-CM

## 2016-07-07 NOTE — Telephone Encounter (Signed)
Pt wants lab order for tomorrow appt scheduled for Friday  Pt notified

## 2016-07-07 NOTE — Progress Notes (Signed)
Order entered for PE on 12/22 with Dr Livia Snellen

## 2016-07-08 ENCOUNTER — Ambulatory Visit (INDEPENDENT_AMBULATORY_CARE_PROVIDER_SITE_OTHER): Payer: BC Managed Care – PPO | Admitting: *Deleted

## 2016-07-08 ENCOUNTER — Other Ambulatory Visit: Payer: BC Managed Care – PPO

## 2016-07-08 DIAGNOSIS — Z23 Encounter for immunization: Secondary | ICD-10-CM

## 2016-07-08 DIAGNOSIS — E7849 Other hyperlipidemia: Secondary | ICD-10-CM

## 2016-07-08 DIAGNOSIS — Z0000 Encounter for general adult medical examination without abnormal findings: Secondary | ICD-10-CM

## 2016-07-09 LAB — CMP14+EGFR
ALT: 6 IU/L (ref 0–44)
AST: 16 IU/L (ref 0–40)
Albumin/Globulin Ratio: 1.5 (ref 1.2–2.2)
Albumin: 4.1 g/dL (ref 3.6–4.8)
Alkaline Phosphatase: 65 IU/L (ref 39–117)
BUN/Creatinine Ratio: 14 (ref 10–24)
BUN: 13 mg/dL (ref 8–27)
Bilirubin Total: 1 mg/dL (ref 0.0–1.2)
CALCIUM: 9.9 mg/dL (ref 8.6–10.2)
CO2: 23 mmol/L (ref 18–29)
CREATININE: 0.95 mg/dL (ref 0.76–1.27)
Chloride: 100 mmol/L (ref 96–106)
GFR calc Af Amer: 99 mL/min/{1.73_m2} (ref >59)
GFR, EST NON AFRICAN AMERICAN: 85 mL/min/{1.73_m2} (ref >59)
GLOBULIN, TOTAL: 2.7 g/dL (ref 1.5–4.5)
Glucose: 102 mg/dL — ABNORMAL HIGH (ref 65–99)
Potassium: 4.3 mmol/L (ref 3.5–5.2)
Sodium: 141 mmol/L (ref 134–144)
TOTAL PROTEIN: 6.8 g/dL (ref 6.0–8.5)

## 2016-07-09 LAB — CBC WITH DIFFERENTIAL/PLATELET
Basophils Absolute: 0.1 10*3/uL (ref 0.0–0.2)
Basos: 1 %
EOS (ABSOLUTE): 0.2 10*3/uL (ref 0.0–0.4)
EOS: 4 %
HEMATOCRIT: 43.5 % (ref 37.5–51.0)
Hemoglobin: 15.4 g/dL (ref 13.0–17.7)
IMMATURE GRANS (ABS): 0 10*3/uL (ref 0.0–0.1)
IMMATURE GRANULOCYTES: 0 %
LYMPHS: 31 %
Lymphocytes Absolute: 1.4 10*3/uL (ref 0.7–3.1)
MCH: 31.8 pg (ref 26.6–33.0)
MCHC: 35.4 g/dL (ref 31.5–35.7)
MCV: 90 fL (ref 79–97)
MONOS ABS: 0.4 10*3/uL (ref 0.1–0.9)
Monocytes: 9 %
NEUTROS PCT: 55 %
Neutrophils Absolute: 2.5 10*3/uL (ref 1.4–7.0)
PLATELETS: 244 10*3/uL (ref 150–379)
RBC: 4.85 x10E6/uL (ref 4.14–5.80)
RDW: 13.5 % (ref 12.3–15.4)
WBC: 4.6 10*3/uL (ref 3.4–10.8)

## 2016-07-09 LAB — PSA, TOTAL AND FREE
PSA, Free Pct: 18.6 %
PSA, Free: 0.39 ng/mL
Prostate Specific Ag, Serum: 2.1 ng/mL (ref 0.0–4.0)

## 2016-07-09 LAB — THYROID PANEL WITH TSH
FREE THYROXINE INDEX: 2 (ref 1.2–4.9)
T3 UPTAKE RATIO: 27 % (ref 24–39)
T4, Total: 7.4 ug/dL (ref 4.5–12.0)
TSH: 2.37 u[IU]/mL (ref 0.450–4.500)

## 2016-07-09 LAB — LIPID PANEL
CHOL/HDL RATIO: 3.9 ratio (ref 0.0–5.0)
Cholesterol, Total: 258 mg/dL — ABNORMAL HIGH (ref 100–199)
HDL: 66 mg/dL (ref >39)
LDL CALC: 175 mg/dL — AB (ref 0–99)
TRIGLYCERIDES: 87 mg/dL (ref 0–149)
VLDL Cholesterol Cal: 17 mg/dL (ref 5–40)

## 2016-07-09 LAB — VITAMIN D 25 HYDROXY (VIT D DEFICIENCY, FRACTURES): VIT D 25 HYDROXY: 25.4 ng/mL — AB (ref 30.0–100.0)

## 2016-07-10 ENCOUNTER — Encounter: Payer: Self-pay | Admitting: Family Medicine

## 2016-07-10 ENCOUNTER — Ambulatory Visit (INDEPENDENT_AMBULATORY_CARE_PROVIDER_SITE_OTHER): Payer: BC Managed Care – PPO | Admitting: Family Medicine

## 2016-07-10 VITALS — BP 139/87 | HR 55 | Temp 97.1°F | Ht 65.0 in | Wt 206.0 lb

## 2016-07-10 DIAGNOSIS — Z0000 Encounter for general adult medical examination without abnormal findings: Secondary | ICD-10-CM | POA: Diagnosis not present

## 2016-07-10 DIAGNOSIS — Z1211 Encounter for screening for malignant neoplasm of colon: Secondary | ICD-10-CM

## 2016-07-10 MED ORDER — EZETIMIBE 10 MG PO TABS: 10.0000 mg | ORAL_TABLET | Freq: Every day | ORAL | 3 refills | Status: DC

## 2016-07-10 MED ORDER — LISINOPRIL 20 MG PO TABS: 20.0000 mg | ORAL_TABLET | Freq: Every day | ORAL | 3 refills | Status: DC

## 2016-07-10 MED ORDER — OMEPRAZOLE 20 MG PO CPDR: 20.0000 mg | DELAYED_RELEASE_CAPSULE | Freq: Every day | ORAL | 3 refills | Status: DC

## 2016-07-10 NOTE — Progress Notes (Signed)
Subjective:  Patient ID: Nathaniel Kelly, male    DOB: 1955-10-07  Age: 60 y.o. MRN: [id number]  CC: Annual Exam   HPI Nathaniel Kelly presents for Annual exam.   History Nathaniel Kelly has a past medical history of Colon polyps; Diverticulosis; GERD (gastroesophageal reflux disease); Hyperlipemia; and Hypertension.   Nathaniel Kelly has a past surgical history that includes Hernia repair; Knee arthroscopy; Carpal tunnel release; Flexible sigmoidoscopy (N/A, 11/07/2012); and Hemorrhoid banding (N/A, 11/07/2012).   His family history includes Colon cancer in his paternal aunt and paternal grandmother; Heart disease in his maternal grandfather and paternal grandfather.Nathaniel Kelly reports that Nathaniel Kelly quit smoking about 30 years ago. Nathaniel Kelly has never used smokeless tobacco. Nathaniel Kelly reports that Nathaniel Kelly drinks alcohol. Nathaniel Kelly reports that Nathaniel Kelly does not use drugs.    ROS Review of Systems  Constitutional: Negative for activity change, appetite change, chills, diaphoresis, fatigue, fever and unexpected weight change.  HENT: Negative for congestion, ear pain, hearing loss, postnasal drip, rhinorrhea, sore throat, tinnitus and trouble swallowing.   Eyes: Negative for photophobia, pain, discharge and redness.  Respiratory: Negative for apnea, cough, choking, chest tightness, shortness of breath, wheezing and stridor.   Cardiovascular: Negative for chest pain, palpitations and leg swelling.  Gastrointestinal: Negative for abdominal distention, abdominal pain, blood in stool, constipation, diarrhea, nausea and vomiting.  Endocrine: Negative for cold intolerance, heat intolerance, polydipsia, polyphagia and polyuria.  Genitourinary: Negative for difficulty urinating, dysuria, enuresis, flank pain, frequency, genital sores, hematuria and urgency.  Musculoskeletal: Negative for arthralgias and joint swelling.  Skin: Negative for color change, rash and wound.  Allergic/Immunologic: Negative for immunocompromised state.  Neurological: Negative for dizziness,  tremors, seizures, syncope, facial asymmetry, speech difficulty, weakness, light-headedness, numbness and headaches.  Hematological: Does not bruise/bleed easily.  Psychiatric/Behavioral: Negative for agitation, behavioral problems, confusion, decreased concentration, dysphoric mood, hallucinations, sleep disturbance and suicidal ideas. The patient is not nervous/anxious and is not hyperactive.     Objective:  BP 139/87   Pulse (!) 55   Temp 97.1 F (36.2 C) (Oral)   Ht 5\' 5"  (1.651 m)   Wt 206 lb (93.4 kg)   BMI 34.28 kg/m   BP Readings from Last 3 Encounters:  07/10/16 139/87  01/07/16 140/88  11/11/15 110/63    Wt Readings from Last 3 Encounters:  07/10/16 206 lb (93.4 kg)  01/07/16 206 lb 12.8 oz (93.8 kg)  11/11/15 209 lb 12.8 oz (95.2 kg)     Physical Exam  Constitutional: Nathaniel Kelly is oriented to person, place, and time. Nathaniel Kelly appears well-developed and well-nourished.  HENT:  Head: Normocephalic and atraumatic.  Mouth/Throat: Oropharynx is clear and moist.  Eyes: EOM are normal. Pupils are equal, round, and reactive to light.  Neck: Normal range of motion. No tracheal deviation present. No thyromegaly present.  Cardiovascular: Normal rate, regular rhythm and normal heart sounds.  Exam reveals no gallop and no friction rub.   No murmur heard. Pulmonary/Chest: Breath sounds normal. Nathaniel Kelly has no wheezes. Nathaniel Kelly has no rales.  Abdominal: Soft. Nathaniel Kelly exhibits no mass. There is no tenderness.  Musculoskeletal: Normal range of motion. Nathaniel Kelly exhibits no edema.  Neurological: Nathaniel Kelly is alert and oriented to person, place, and time.  Skin: Skin is warm and dry.  Psychiatric: Nathaniel Kelly has a normal mood and affect.     Lab Results  Component Value Date   WBC 4.6 07/08/2016   HGB 14.7 01/29/2015   HCT 43.5 07/08/2016   PLT 244 07/08/2016   GLUCOSE 102 (H) 07/08/2016   CHOL 258 (  H) 07/08/2016   TRIG 87 07/08/2016   HDL 66 07/08/2016   LDLCALC 175 (H) 07/08/2016   ALT 6 07/08/2016   AST 16  07/08/2016   NA 141 07/08/2016   K 4.3 07/08/2016   CL 100 07/08/2016   CREATININE 0.95 07/08/2016   BUN 13 07/08/2016   CO2 23 07/08/2016   TSH 2.370 07/08/2016   PSA 0.9 10/26/2014    No results found.  Assessment & Plan:   Nathaniel Kelly was seen today for annual exam.  Diagnoses and all orders for this visit:  Screening for colon cancer -     Fecal occult blood, imunochemical; Future -     Fecal occult blood, imunochemical  Other orders -     lisinopril (PRINIVIL,ZESTRIL) 20 MG tablet; Take 1 tablet (20 mg total) by mouth daily. as directed -     omeprazole (PRILOSEC) 20 MG capsule; Take 1 capsule (20 mg total) by mouth daily. -     ezetimibe (ZETIA) 10 MG tablet; Take 1 tablet (10 mg total) by mouth daily. For cholesterol    I have discontinued Mr. Bula fluocinonide cream, omeprazole, psyllium, hydrocortisone, Co Q-10, Turmeric, rosuvastatin, azithromycin, predniSONE, HYDROcodone-homatropine, benzonatate, and fluticasone. I have also changed his omeprazole. Additionally, I am having him start on ezetimibe. Lastly, I am having him maintain his aspirin, Red Yeast Rice, and lisinopril.  Meds ordered this encounter  Medications  . DISCONTD: omeprazole (PRILOSEC) 20 MG capsule    Sig: Take 20 mg by mouth daily.  . Red Yeast Rice 600 MG CAPS    Sig: Take 600 mg by mouth 2 (two) times daily.  Marland Kitchen lisinopril (PRINIVIL,ZESTRIL) 20 MG tablet    Sig: Take 1 tablet (20 mg total) by mouth daily. as directed    Dispense:  90 tablet    Refill:  3  . omeprazole (PRILOSEC) 20 MG capsule    Sig: Take 1 capsule (20 mg total) by mouth daily.    Dispense:  90 capsule    Refill:  3  . ezetimibe (ZETIA) 10 MG tablet    Sig: Take 1 tablet (10 mg total) by mouth daily. For cholesterol    Dispense:  90 tablet    Refill:  3     Follow-up: No Follow-up on file.  Claretta Fraise, M.D.

## 2016-07-11 LAB — FECAL OCCULT BLOOD, IMMUNOCHEMICAL: Fecal Occult Bld: NEGATIVE

## 2017-02-02 ENCOUNTER — Ambulatory Visit (INDEPENDENT_AMBULATORY_CARE_PROVIDER_SITE_OTHER): Payer: BC Managed Care – PPO

## 2017-02-02 ENCOUNTER — Ambulatory Visit (INDEPENDENT_AMBULATORY_CARE_PROVIDER_SITE_OTHER): Payer: BC Managed Care – PPO | Admitting: Family Medicine

## 2017-02-02 ENCOUNTER — Encounter: Payer: Self-pay | Admitting: Family Medicine

## 2017-02-02 VITALS — BP 138/88 | HR 58 | Temp 97.4°F | Ht 65.0 in | Wt 207.0 lb

## 2017-02-02 DIAGNOSIS — G44319 Acute post-traumatic headache, not intractable: Secondary | ICD-10-CM | POA: Diagnosis not present

## 2017-02-02 DIAGNOSIS — M542 Cervicalgia: Secondary | ICD-10-CM

## 2017-02-02 MED ORDER — DICLOFENAC SODIUM 75 MG PO TBEC: 75.0000 mg | DELAYED_RELEASE_TABLET | Freq: Two times a day (BID) | ORAL | 2 refills | Status: DC

## 2017-02-02 MED ORDER — CYCLOBENZAPRINE HCL 10 MG PO TABS: 10.0000 mg | ORAL_TABLET | Freq: Three times a day (TID) | ORAL | 1 refills | Status: DC | PRN
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
## 2017-02-08 ENCOUNTER — Ambulatory Visit (HOSPITAL_COMMUNITY): Payer: BC Managed Care – PPO | Attending: Family Medicine

## 2017-02-08 ENCOUNTER — Encounter (HOSPITAL_COMMUNITY): Payer: Self-pay

## 2017-02-08 DIAGNOSIS — M542 Cervicalgia: Secondary | ICD-10-CM | POA: Insufficient documentation
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
## 2017-02-08 NOTE — Patient Instructions (Addendum)
  CERVICAL TOWEL ROTATION STRETCH  Hold the ends of a small folded bath towel and wrap it around your head and neck as shown. Place the towel on your face so as to minimize placing pressure on your jaw. Pressure should be placed on the side of your face/cheek bone.   Use your bottom most arm to anchor the towel in place. Use your top most arm to pull the towel to cause a gentle rotational stretch in your neck. Hold, then return to starting position and repeat.    Cervical Retraction Ronalee Red Tuck  While maintaining good posture, sit neutrally and place your pointer finger against your chin while looking straight ahead. Without moving the finger, draw your head directly backwards, keeping your gaze parallel to the floor. Return to neutral, bringing your chin back against your pointer finger. Make sure you are not going past the neutral neck position.

## 2017-05-06 ENCOUNTER — Ambulatory Visit (INDEPENDENT_AMBULATORY_CARE_PROVIDER_SITE_OTHER): Payer: BC Managed Care – PPO

## 2017-05-06 DIAGNOSIS — Z23 Encounter for immunization: Secondary | ICD-10-CM

## 2017-07-06 ENCOUNTER — Other Ambulatory Visit: Payer: Self-pay | Admitting: Family Medicine

## 2017-10-14 ENCOUNTER — Other Ambulatory Visit: Payer: Self-pay | Admitting: Family Medicine

## 2017-10-14 NOTE — Telephone Encounter (Signed)
Last seen 02/02/17  Dr Livia Snellen

## 2017-10-15 NOTE — Telephone Encounter (Signed)
Authorize 30 days only. Then contact the patient letting them know that they will need an appointment before any further prescriptions can be sent in. 

## 2018-01-12 ENCOUNTER — Other Ambulatory Visit: Payer: Self-pay | Admitting: Family Medicine
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
## 2018-06-01 ENCOUNTER — Other Ambulatory Visit: Payer: Self-pay

## 2018-06-01 ENCOUNTER — Ambulatory Visit (INDEPENDENT_AMBULATORY_CARE_PROVIDER_SITE_OTHER): Payer: BC Managed Care – PPO

## 2018-06-01 DIAGNOSIS — Z0000 Encounter for general adult medical examination without abnormal findings: Secondary | ICD-10-CM

## 2018-06-01 DIAGNOSIS — Z23 Encounter for immunization: Secondary | ICD-10-CM

## 2018-06-15 ENCOUNTER — Other Ambulatory Visit: Payer: BC Managed Care – PPO

## 2018-06-15 DIAGNOSIS — Z0000 Encounter for general adult medical examination without abnormal findings: Secondary | ICD-10-CM

## 2018-06-16 LAB — LIPID PANEL
CHOL/HDL RATIO: 3.4 ratio (ref 0.0–5.0)
CHOLESTEROL TOTAL: 247 mg/dL — AB (ref 100–199)
HDL: 72 mg/dL (ref >39)
LDL CALC: 160 mg/dL — AB (ref 0–99)
Triglycerides: 75 mg/dL (ref 0–149)
VLDL CHOLESTEROL CAL: 15 mg/dL (ref 5–40)

## 2018-06-16 LAB — CMP14+EGFR
ALK PHOS: 63 IU/L (ref 39–117)
ALT: 6 IU/L (ref 0–44)
AST: 19 IU/L (ref 0–40)
Albumin/Globulin Ratio: 1.8 (ref 1.2–2.2)
Albumin: 4.3 g/dL (ref 3.6–4.8)
BUN/Creatinine Ratio: 20 (ref 10–24)
BUN: 20 mg/dL (ref 8–27)
Bilirubin Total: 1.6 mg/dL — ABNORMAL HIGH (ref 0.0–1.2)
CALCIUM: 9.5 mg/dL (ref 8.6–10.2)
CO2: 21 mmol/L (ref 20–29)
CREATININE: 1.01 mg/dL (ref 0.76–1.27)
Chloride: 101 mmol/L (ref 96–106)
GFR calc Af Amer: 90 mL/min/{1.73_m2} (ref >59)
GFR, EST NON AFRICAN AMERICAN: 78 mL/min/{1.73_m2} (ref >59)
GLOBULIN, TOTAL: 2.4 g/dL (ref 1.5–4.5)
GLUCOSE: 90 mg/dL (ref 65–99)
Potassium: 4.6 mmol/L (ref 3.5–5.2)
SODIUM: 138 mmol/L (ref 134–144)
Total Protein: 6.7 g/dL (ref 6.0–8.5)

## 2018-06-16 LAB — CBC WITH DIFFERENTIAL/PLATELET
BASOS ABS: 0.1 10*3/uL (ref 0.0–0.2)
Basos: 1 %
EOS (ABSOLUTE): 0.1 10*3/uL (ref 0.0–0.4)
Eos: 2 %
HEMATOCRIT: 45.1 % (ref 37.5–51.0)
HEMOGLOBIN: 15.2 g/dL (ref 13.0–17.7)
IMMATURE GRANS (ABS): 0 10*3/uL (ref 0.0–0.1)
IMMATURE GRANULOCYTES: 0 %
LYMPHS: 32 %
Lymphocytes Absolute: 1.6 10*3/uL (ref 0.7–3.1)
MCH: 30.6 pg (ref 26.6–33.0)
MCHC: 33.7 g/dL (ref 31.5–35.7)
MCV: 91 fL (ref 79–97)
MONOCYTES: 8 %
Monocytes Absolute: 0.4 10*3/uL (ref 0.1–0.9)
NEUTROS PCT: 57 %
Neutrophils Absolute: 2.8 10*3/uL (ref 1.4–7.0)
Platelets: 224 10*3/uL (ref 150–450)
RBC: 4.96 x10E6/uL (ref 4.14–5.80)
RDW: 12.7 % (ref 12.3–15.4)
WBC: 4.9 10*3/uL (ref 3.4–10.8)

## 2018-06-16 LAB — PSA, TOTAL AND FREE
PSA, Free Pct: 22.9 %
PSA, Free: 0.16 ng/mL
Prostate Specific Ag, Serum: 0.7 ng/mL (ref 0.0–4.0)

## 2018-06-16 LAB — THYROID PANEL WITH TSH
Free Thyroxine Index: 2.4 (ref 1.2–4.9)
T3 UPTAKE RATIO: 27 % (ref 24–39)
T4 TOTAL: 8.8 ug/dL (ref 4.5–12.0)
TSH: 2.19 u[IU]/mL (ref 0.450–4.500)

## 2018-06-16 LAB — VITAMIN D 25 HYDROXY (VIT D DEFICIENCY, FRACTURES): VIT D 25 HYDROXY: 21.5 ng/mL — AB (ref 30.0–100.0)

## 2018-06-21 ENCOUNTER — Encounter: Payer: BC Managed Care – PPO | Admitting: Family Medicine

## 2018-06-27 ENCOUNTER — Encounter: Payer: Self-pay | Admitting: Family Medicine

## 2018-06-27 ENCOUNTER — Ambulatory Visit (INDEPENDENT_AMBULATORY_CARE_PROVIDER_SITE_OTHER): Payer: BC Managed Care – PPO | Admitting: Family Medicine

## 2018-06-27 VITALS — BP 130/73 | HR 60 | Temp 97.1°F | Ht 65.0 in | Wt 202.0 lb

## 2018-06-27 DIAGNOSIS — Z0000 Encounter for general adult medical examination without abnormal findings: Secondary | ICD-10-CM | POA: Diagnosis not present

## 2018-06-27 DIAGNOSIS — I10 Essential (primary) hypertension: Secondary | ICD-10-CM

## 2018-06-27 DIAGNOSIS — E559 Vitamin D deficiency, unspecified: Secondary | ICD-10-CM

## 2018-06-27 MED ORDER — MELOXICAM 15 MG PO TABS: 15.0000 mg | ORAL_TABLET | Freq: Every day | ORAL | 5 refills | Status: DC

## 2018-06-27 MED ORDER — HYDROCORTISONE ACETATE 25 MG RE SUPP: 25.0000 mg | Freq: Four times a day (QID) | RECTAL | 5 refills | Status: DC

## 2018-06-27 MED ORDER — VITAMIN D (ERGOCALCIFEROL) 1.25 MG (50000 UNIT) PO CAPS: 50000.0000 [IU] | ORAL_CAPSULE | ORAL | 3 refills | Status: AC

## 2018-06-27 MED ORDER — FLUOCINONIDE 0.05 % EX CREA: 1.0000 "application " | TOPICAL_CREAM | Freq: Two times a day (BID) | CUTANEOUS | 3 refills | Status: DC

## 2018-06-27 MED ORDER — PITAVASTATIN CALCIUM 2 MG PO TABS: 2.0000 mg | ORAL_TABLET | Freq: Every day | ORAL | 5 refills | Status: DC

## 2018-06-27 NOTE — Patient Instructions (Signed)
Take Co- Q 10 - 100mg  daily Keep a watch on BP - try and keep under 135/85.

## 2018-06-27 NOTE — Progress Notes (Signed)
Subjective:  Patient ID: Nathaniel Kelly, male    DOB: May 30, 1956  Age: 62 y.o. MRN: [id number]  CC: Annual Exam   HPI Nathaniel Kelly presents for annual physical. Some joint pain that makes it hard to button shirt, tie shoes. Low vitamin  D on blod work done 2 weeks ago.Eating better, low carbs for 1 year. Wife checking his BP. Has weaned himself off of lisinopril. No longer using PPI.   Depression screen [facility] 2/9 06/27/2018 02/02/2017 07/10/2016  Decreased Interest 0 0 0  Down, Depressed, Hopeless 0 0 0  PHQ - 2 Score 0 0 0    History Azarion has a past medical history of Colon polyps, Diverticulosis, GERD (gastroesophageal reflux disease), Hyperlipemia, and Hypertension.   He has a past surgical history that includes Hernia repair; Knee arthroscopy; Carpal tunnel release; Flexible sigmoidoscopy (N/A, 11/07/2012); and Hemorrhoid banding (N/A, 11/07/2012).   His family history includes Colon cancer in his paternal aunt and paternal grandmother; Heart disease in his maternal grandfather and paternal grandfather.He reports that he quit smoking about 31 years ago. He has never used smokeless tobacco. He reports that he drinks alcohol. He reports that he does not use drugs.    ROS Review of Systems  Constitutional: Negative for activity change, fatigue and unexpected weight change.  HENT: Negative for congestion, ear pain, hearing loss, postnasal drip and trouble swallowing.   Eyes: Negative for pain and visual disturbance.  Respiratory: Negative for cough, chest tightness and shortness of breath.   Cardiovascular: Negative for chest pain, palpitations and leg swelling.  Gastrointestinal: Negative for abdominal distention, abdominal pain, blood in stool, constipation, diarrhea, nausea and vomiting.  Endocrine: Negative for cold intolerance, heat intolerance and polydipsia.  Genitourinary: Negative for difficulty urinating, dysuria, flank pain, frequency and urgency.  Musculoskeletal: Negative for  arthralgias and joint swelling.  Skin: Negative for color change, rash and wound.  Neurological: Negative for dizziness, syncope, speech difficulty, weakness, light-headedness, numbness and headaches.  Hematological: Does not bruise/bleed easily.  Psychiatric/Behavioral: Negative for confusion, decreased concentration, dysphoric mood and sleep disturbance. The patient is not nervous/anxious.     Objective:  BP 130/73 (BP Location: Left Arm)   Pulse 60   Temp (!) 97.1 F (36.2 C) (Oral)   Ht 5\' 5"  (1.651 m)   Wt 202 lb (91.6 kg)   BMI 33.61 kg/m   BP Readings from Last 3 Encounters:  06/27/18 130/73  02/02/17 138/88  07/10/16 139/87    Wt Readings from Last 3 Encounters:  06/27/18 202 lb (91.6 kg)  02/02/17 207 lb (93.9 kg)  07/10/16 206 lb (93.4 kg)     Physical Exam  Constitutional: He is oriented to person, place, and time. He appears well-developed and well-nourished.  HENT:  Head: Normocephalic and atraumatic.  Mouth/Throat: Oropharynx is clear and moist.  Eyes: Pupils are equal, round, and reactive to light. EOM are normal.  Neck: Normal range of motion. No tracheal deviation present. No thyromegaly present.  Cardiovascular: Normal rate, regular rhythm and normal heart sounds. Exam reveals no gallop and no friction rub.  No murmur heard. Pulmonary/Chest: Breath sounds normal. He has no wheezes. He has no rales.  Abdominal: Soft. Bowel sounds are normal. He exhibits no distension and no mass. There is no tenderness. Hernia confirmed negative in the right inguinal area and confirmed negative in the left inguinal area.  Genitourinary: Testes normal and penis normal.  Musculoskeletal: Normal range of motion. He exhibits no edema.  Lymphadenopathy:    He  has no cervical adenopathy.  Neurological: He is alert and oriented to person, place, and time.  Skin: Skin is warm and dry.  Psychiatric: He has a normal mood and affect.      Assessment & Plan:   Cleophus was seen  today for annual exam.  Diagnoses and all orders for this visit:  Well adult exam  Essential hypertension  Vitamin D deficiency  Other orders -     Pitavastatin Calcium (LIVALO) 2 MG TABS; Take 1 tablet (2 mg total) by mouth daily after supper. -     meloxicam (MOBIC) 15 MG tablet; Take 1 tablet (15 mg total) by mouth daily. For joint and muscle pain -     Vitamin D, Ergocalciferol, (DRISDOL) 1.25 MG (50000 UT) CAPS capsule; Take 1 capsule (50,000 Units total) by mouth every 7 (seven) days. -     hydrocortisone (ANUSOL-HC) 25 MG suppository; Place 1 suppository (25 mg total) rectally 4 (four) times daily. -     fluocinonide cream (LIDEX) 0.05 %; Apply 1 application topically 2 (two) times daily.       I have discontinued Francisco Hufstedler's aspirin, omeprazole, diclofenac, cyclobenzaprine, and lisinopril. I am also having him start on Pitavastatin Calcium, meloxicam, and Vitamin D (Ergocalciferol). Additionally, I am having him maintain his hydrocortisone and fluocinonide cream.  Allergies as of 06/27/2018      Reactions   Atorvastatin Other (See Comments)   myalgia   Statins Other (See Comments)   Joint problems      Medication List        Accurate as of 06/27/18  3:42 PM. Always use your most recent med list.          fluocinonide cream 0.05 % Commonly known as:  LIDEX Apply 1 application topically 2 (two) times daily.   hydrocortisone 25 MG suppository Commonly known as:  ANUSOL-HC Place 1 suppository (25 mg total) rectally 4 (four) times daily.   meloxicam 15 MG tablet Commonly known as:  MOBIC Take 1 tablet (15 mg total) by mouth daily. For joint and muscle pain   Pitavastatin Calcium 2 MG Tabs Take 1 tablet (2 mg total) by mouth daily after supper.   Vitamin D (Ergocalciferol) 1.25 MG (50000 UT) Caps capsule Commonly known as:  DRISDOL Take 1 capsule (50,000 Units total) by mouth every 7 (seven) days.        Follow-up: Return in about 3 months (around  09/26/2018).  Nathaniel Kelly, M.D.

## 2018-06-28 ENCOUNTER — Telehealth: Payer: Self-pay

## 2018-06-28 NOTE — Telephone Encounter (Signed)
He has had reactions to atorvastatin and rosuvastatin, please try prior auth... Thanks, WS

## 2018-06-28 NOTE — Telephone Encounter (Signed)
Livalo not formulary  Preferred are Simvastatin Atorvastatin Pravastatin or Rosuvastatin

## 2018-08-02 ENCOUNTER — Telehealth: Payer: Self-pay

## 2018-08-02 NOTE — Telephone Encounter (Signed)
FYI: Patient never returned the consent to appeal the denial of his Livalo

## 2018-09-27 ENCOUNTER — Encounter: Payer: Self-pay | Admitting: Family Medicine

## 2018-09-27 ENCOUNTER — Ambulatory Visit (INDEPENDENT_AMBULATORY_CARE_PROVIDER_SITE_OTHER): Payer: Medicare Other | Admitting: Family Medicine

## 2018-09-27 VITALS — BP 123/71 | HR 54 | Temp 98.1°F | Ht 65.0 in | Wt 204.5 lb

## 2018-09-27 DIAGNOSIS — I10 Essential (primary) hypertension: Secondary | ICD-10-CM

## 2018-09-27 DIAGNOSIS — E669 Obesity, unspecified: Secondary | ICD-10-CM

## 2018-09-27 DIAGNOSIS — E782 Mixed hyperlipidemia: Secondary | ICD-10-CM | POA: Diagnosis not present

## 2018-09-27 MED ORDER — LISINOPRIL 20 MG PO TABS: 20.0000 mg | ORAL_TABLET | Freq: Every day | ORAL | 5 refills | Status: DC

## 2018-09-28 ENCOUNTER — Telehealth: Payer: Self-pay | Admitting: *Deleted

## 2018-09-28 ENCOUNTER — Other Ambulatory Visit: Payer: Self-pay | Admitting: Family Medicine

## 2018-09-28 LAB — CMP14+EGFR
ALBUMIN: 4.3 g/dL (ref 3.8–4.8)
ALT: 7 IU/L (ref 0–44)
AST: 16 IU/L (ref 0–40)
Albumin/Globulin Ratio: 2.2 (ref 1.2–2.2)
Alkaline Phosphatase: 64 IU/L (ref 39–117)
BUN / CREAT RATIO: 19 (ref 10–24)
BUN: 18 mg/dL (ref 8–27)
Bilirubin Total: 1.3 mg/dL — ABNORMAL HIGH (ref 0.0–1.2)
CO2: 22 mmol/L (ref 20–29)
Calcium: 9.2 mg/dL (ref 8.6–10.2)
Chloride: 103 mmol/L (ref 96–106)
Creatinine, Ser: 0.96 mg/dL (ref 0.76–1.27)
GFR calc Af Amer: 95 mL/min/{1.73_m2} (ref >59)
GFR calc non Af Amer: 83 mL/min/{1.73_m2} (ref >59)
Globulin, Total: 2 g/dL (ref 1.5–4.5)
Glucose: 88 mg/dL (ref 65–99)
Potassium: 4.4 mmol/L (ref 3.5–5.2)
Sodium: 141 mmol/L (ref 134–144)
Total Protein: 6.3 g/dL (ref 6.0–8.5)

## 2018-09-28 LAB — LIPID PANEL
Chol/HDL Ratio: 3.1 ratio (ref 0.0–5.0)
Cholesterol, Total: 231 mg/dL — ABNORMAL HIGH (ref 100–199)
HDL: 74 mg/dL (ref >39)
LDL Calculated: 142 mg/dL — ABNORMAL HIGH (ref 0–99)
Triglycerides: 76 mg/dL (ref 0–149)
VLDL Cholesterol Cal: 15 mg/dL (ref 5–40)

## 2018-09-28 MED ORDER — PITAVASTATIN CALCIUM 2 MG PO TABS: 2.0000 mg | ORAL_TABLET | Freq: Every day | ORAL | 5 refills | Status: DC

## 2018-09-28 NOTE — Telephone Encounter (Signed)
-----   Message from Claretta Fraise, MD sent at 09/28/2018  3:03 PM EDT ----- Cholesterol is high. Pleasetake the med, Livalo (pitavastatin) as discussed  Refills sent.

## 2018-10-01 ENCOUNTER — Encounter: Payer: Self-pay | Admitting: Family Medicine

## 2018-10-01 NOTE — Progress Notes (Signed)
Subjective:  Patient ID: Nathaniel Kelly, male    DOB: 08/03/1955  Age: 63 y.o. MRN: [id number]  CC: Medical Management of Chronic Issues   HPI Nathaniel Kelly presents for  follow-up of hypertension. Patient has no history of headache chest pain or shortness of breath or recent cough. Patient also denies symptoms of TIA such as focal numbness or weakness. Patient denies side effects from medication. States taking it regularly.  Patient in for follow-up of elevated cholesterol. Doing well without complaints on current medication. Denies side effects of statin including myalgia and arthralgia and nausea. Also in today for liver function testing. Currently no chest pain, shortness of breath or other cardiovascular related symptoms noted. History Nathaniel Kelly has a past medical history of Colon polyps, Diverticulosis, GERD (gastroesophageal reflux disease), Hyperlipemia, and Hypertension.   He has a past surgical history that includes Hernia repair; Knee arthroscopy; Carpal tunnel release; Flexible sigmoidoscopy (N/A, 11/07/2012); and Hemorrhoid banding (N/A, 11/07/2012).   His family history includes Colon cancer in his paternal aunt and paternal grandmother; Heart disease in his maternal grandfather and paternal grandfather.He reports that he quit smoking about 32 years ago. He has never used smokeless tobacco. He reports current alcohol use. He reports that he does not use drugs.  Current Outpatient Medications on File Prior to Visit  Medication Sig Dispense Refill  . fluocinonide cream (LIDEX) 8.34 % Apply 1 application topically 2 (two) times daily. 120 g 3  . hydrocortisone (ANUSOL-HC) 25 MG suppository Place 1 suppository (25 mg total) rectally 4 (four) times daily. 56 suppository 5  . meloxicam (MOBIC) 15 MG tablet Take 1 tablet (15 mg total) by mouth daily. For joint and muscle pain 30 tablet 5  . Vitamin D, Ergocalciferol, (DRISDOL) 1.25 MG (50000 UT) CAPS capsule Take 1 capsule (50,000 Units total)  by mouth every 7 (seven) days. 13 capsule 3   No current facility-administered medications on file prior to visit.     ROS Review of Systems  Constitutional: Negative.   HENT: Negative.   Eyes: Negative for visual disturbance.  Respiratory: Negative for cough and shortness of breath.   Cardiovascular: Negative for chest pain and leg swelling.  Gastrointestinal: Negative for abdominal pain, diarrhea, nausea and vomiting.  Genitourinary: Negative for difficulty urinating.  Musculoskeletal: Negative for arthralgias and myalgias.  Skin: Negative for rash.  Neurological: Negative for headaches.  Psychiatric/Behavioral: Negative for sleep disturbance.    Objective:  BP 123/71   Pulse (!) 54   Temp 98.1 F (36.7 C) (Oral)   Ht 5' 5"  (1.651 m)   Wt 204 lb 8 oz (92.8 kg)   BMI 34.03 kg/m   BP Readings from Last 3 Encounters:  09/27/18 123/71  06/27/18 130/73  02/02/17 138/88    Wt Readings from Last 3 Encounters:  09/27/18 204 lb 8 oz (92.8 kg)  06/27/18 202 lb (91.6 kg)  02/02/17 207 lb (93.9 kg)     Physical Exam Constitutional:      General: He is not in acute distress.    Appearance: He is well-developed.  HENT:     Head: Normocephalic and atraumatic.     Right Ear: External ear normal.     Left Ear: External ear normal.     Nose: Nose normal.  Eyes:     Conjunctiva/sclera: Conjunctivae normal.     Pupils: Pupils are equal, round, and reactive to light.  Neck:     Musculoskeletal: Normal range of motion and neck supple.  Cardiovascular:  Rate and Rhythm: Normal rate and regular rhythm.     Heart sounds: Normal heart sounds. No murmur.  Pulmonary:     Effort: Pulmonary effort is normal. No respiratory distress.     Breath sounds: Normal breath sounds. No wheezing or rales.  Abdominal:     Palpations: Abdomen is soft.     Tenderness: There is no abdominal tenderness.  Musculoskeletal: Normal range of motion.  Skin:    General: Skin is warm and dry.   Neurological:     Mental Status: He is alert and oriented to person, place, and time.     Deep Tendon Reflexes: Reflexes are normal and symmetric.  Psychiatric:        Behavior: Behavior normal.        Thought Content: Thought content normal.        Judgment: Judgment normal.       Assessment & Plan:   Nathaniel Kelly was seen today for medical management of chronic issues.  Diagnoses and all orders for this visit:  Essential hypertension  Mixed hyperlipidemia -     CMP14+EGFR -     Lipid panel  Obesity (BMI 30-39.9)  Other orders -     lisinopril (PRINIVIL,ZESTRIL) 20 MG tablet; Take 1 tablet (20 mg total) by mouth daily.   Allergies as of 09/27/2018      Reactions   Atorvastatin Other (See Comments)   myalgia   Statins Other (See Comments)   Joint problems      Medication List       Accurate as of September 27, 2018 11:59 PM. Always use your most recent med list.        fluocinonide cream 0.05 % Commonly known as:  LIDEX Apply 1 application topically 2 (two) times daily.   hydrocortisone 25 MG suppository Commonly known as:  ANUSOL-HC Place 1 suppository (25 mg total) rectally 4 (four) times daily.   lisinopril 20 MG tablet Commonly known as:  PRINIVIL,ZESTRIL Take 1 tablet (20 mg total) by mouth daily.   meloxicam 15 MG tablet Commonly known as:  MOBIC Take 1 tablet (15 mg total) by mouth daily. For joint and muscle pain   Pitavastatin Calcium 2 MG Tabs Commonly known as:  Livalo Take 1 tablet (2 mg total) by mouth daily after supper.   Vitamin D (Ergocalciferol) 1.25 MG (50000 UT) Caps capsule Commonly known as:  DRISDOL Take 1 capsule (50,000 Units total) by mouth every 7 (seven) days.       Meds ordered this encounter  Medications  . lisinopril (PRINIVIL,ZESTRIL) 20 MG tablet    Sig: Take 1 tablet (20 mg total) by mouth daily.    Dispense:  30 tablet    Refill:  5    Weight loss strategies reviewed  Follow-up: Return in about 6 months (around  03/30/2019).  Claretta Fraise, M.D.

## 2018-10-04 ENCOUNTER — Telehealth: Payer: Self-pay

## 2018-10-04 ENCOUNTER — Other Ambulatory Visit: Payer: Self-pay | Admitting: Family Medicine

## 2018-10-04 MED ORDER — ROSUVASTATIN CALCIUM 10 MG PO TABS: 10.0000 mg | ORAL_TABLET | Freq: Every day | ORAL | 1 refills | Status: DC

## 2018-10-04 NOTE — Telephone Encounter (Signed)
Left message h details of medication and to please call our office for any questions.

## 2018-10-04 NOTE — Telephone Encounter (Signed)
I recommend he try crestor. A very safe med. For cholesterol.

## 2018-10-04 NOTE — Telephone Encounter (Signed)
Insurance denied prior authorization for Livalo  This is a plan exclusion

## 2018-11-20 ENCOUNTER — Other Ambulatory Visit: Payer: Self-pay | Admitting: Family Medicine

## 2019-02-01 ENCOUNTER — Other Ambulatory Visit: Payer: Self-pay | Admitting: Family Medicine

## 2019-04-04 ENCOUNTER — Ambulatory Visit: Payer: Medicare Other | Admitting: Family Medicine

## 2019-04-19 DIAGNOSIS — Z23 Encounter for immunization: Secondary | ICD-10-CM | POA: Diagnosis not present

## 2019-05-19 ENCOUNTER — Other Ambulatory Visit: Payer: Self-pay | Admitting: Family Medicine

## 2019-05-31 ENCOUNTER — Other Ambulatory Visit: Payer: Self-pay | Admitting: Family Medicine

## 2019-06-10 ENCOUNTER — Other Ambulatory Visit: Payer: Self-pay | Admitting: Family Medicine

## 2019-07-19 ENCOUNTER — Other Ambulatory Visit: Payer: Self-pay | Admitting: Family Medicine

## 2019-07-19 NOTE — Telephone Encounter (Signed)
Left patient message to call back and make appointment

## 2019-07-19 NOTE — Telephone Encounter (Signed)
Stacks. NTBS 30 days given 05/19/19

## 2019-08-25 DIAGNOSIS — Z20828 Contact with and (suspected) exposure to other viral communicable diseases: Secondary | ICD-10-CM | POA: Diagnosis not present

## 2019-09-13 DIAGNOSIS — Z23 Encounter for immunization: Secondary | ICD-10-CM | POA: Diagnosis not present

## 2019-09-20 ENCOUNTER — Telehealth: Payer: Self-pay | Admitting: Family Medicine

## 2019-10-04 ENCOUNTER — Telehealth: Payer: Self-pay | Admitting: Family Medicine

## 2019-10-04 ENCOUNTER — Other Ambulatory Visit: Payer: Self-pay | Admitting: Family Medicine

## 2019-10-04 DIAGNOSIS — I10 Essential (primary) hypertension: Secondary | ICD-10-CM | POA: Insufficient documentation

## 2019-10-04 DIAGNOSIS — E782 Mixed hyperlipidemia: Secondary | ICD-10-CM

## 2019-10-04 DIAGNOSIS — E559 Vitamin D deficiency, unspecified: Secondary | ICD-10-CM

## 2019-10-04 DIAGNOSIS — Z125 Encounter for screening for malignant neoplasm of prostate: Secondary | ICD-10-CM

## 2019-10-04 NOTE — Telephone Encounter (Signed)
The labs have been ordered.

## 2019-10-05 NOTE — Telephone Encounter (Signed)
Pt knows to come 3 days prior to is appt for labs

## 2019-10-10 ENCOUNTER — Encounter: Payer: Self-pay | Admitting: Family Medicine

## 2019-10-11 DIAGNOSIS — Z23 Encounter for immunization: Secondary | ICD-10-CM | POA: Diagnosis not present

## 2019-10-16 ENCOUNTER — Other Ambulatory Visit: Payer: Self-pay

## 2019-10-16 ENCOUNTER — Other Ambulatory Visit: Payer: Medicare Other

## 2019-10-16 DIAGNOSIS — Z125 Encounter for screening for malignant neoplasm of prostate: Secondary | ICD-10-CM | POA: Diagnosis not present

## 2019-10-16 DIAGNOSIS — E782 Mixed hyperlipidemia: Secondary | ICD-10-CM | POA: Diagnosis not present

## 2019-10-16 DIAGNOSIS — E559 Vitamin D deficiency, unspecified: Secondary | ICD-10-CM | POA: Diagnosis not present

## 2019-10-16 DIAGNOSIS — I10 Essential (primary) hypertension: Secondary | ICD-10-CM | POA: Diagnosis not present

## 2019-10-17 LAB — URINALYSIS
Bilirubin, UA: NEGATIVE
Glucose, UA: NEGATIVE
Ketones, UA: NEGATIVE
Leukocytes,UA: NEGATIVE
Nitrite, UA: NEGATIVE
Protein,UA: NEGATIVE
RBC, UA: NEGATIVE
Specific Gravity, UA: 1.017 (ref 1.005–1.030)
Urobilinogen, Ur: 0.2 mg/dL (ref 0.2–1.0)
pH, UA: 6.5 (ref 5.0–7.5)

## 2019-10-17 LAB — LIPID PANEL
Chol/HDL Ratio: 3.6 ratio (ref 0.0–5.0)
Cholesterol, Total: 276 mg/dL — ABNORMAL HIGH (ref 100–199)
HDL: 76 mg/dL (ref >39)
LDL Chol Calc (NIH): 179 mg/dL — ABNORMAL HIGH (ref 0–99)
Triglycerides: 119 mg/dL (ref 0–149)
VLDL Cholesterol Cal: 21 mg/dL (ref 5–40)

## 2019-10-17 LAB — CMP14+EGFR
ALT: 7 IU/L (ref 0–44)
AST: 19 IU/L (ref 0–40)
Albumin/Globulin Ratio: 1.4 (ref 1.2–2.2)
Albumin: 4.3 g/dL (ref 3.8–4.8)
Alkaline Phosphatase: 86 IU/L (ref 39–117)
BUN/Creatinine Ratio: 20 (ref 10–24)
BUN: 21 mg/dL (ref 8–27)
Bilirubin Total: 1.5 mg/dL — ABNORMAL HIGH (ref 0.0–1.2)
CO2: 25 mmol/L (ref 20–29)
Calcium: 9.9 mg/dL (ref 8.6–10.2)
Chloride: 97 mmol/L (ref 96–106)
Creatinine, Ser: 1.07 mg/dL (ref 0.76–1.27)
GFR calc Af Amer: 83 mL/min/{1.73_m2} (ref >59)
GFR calc non Af Amer: 72 mL/min/{1.73_m2} (ref >59)
Globulin, Total: 3 g/dL (ref 1.5–4.5)
Glucose: 81 mg/dL (ref 65–99)
Potassium: 4.8 mmol/L (ref 3.5–5.2)
Sodium: 134 mmol/L (ref 134–144)
Total Protein: 7.3 g/dL (ref 6.0–8.5)

## 2019-10-17 LAB — CBC WITH DIFFERENTIAL/PLATELET
Basophils Absolute: 0.1 10*3/uL (ref 0.0–0.2)
Basos: 1 %
EOS (ABSOLUTE): 0.4 10*3/uL (ref 0.0–0.4)
Eos: 6 %
Hematocrit: 46.9 % (ref 37.5–51.0)
Hemoglobin: 16.1 g/dL (ref 13.0–17.7)
Immature Grans (Abs): 0 10*3/uL (ref 0.0–0.1)
Immature Granulocytes: 0 %
Lymphocytes Absolute: 1.7 10*3/uL (ref 0.7–3.1)
Lymphs: 28 %
MCH: 31 pg (ref 26.6–33.0)
MCHC: 34.3 g/dL (ref 31.5–35.7)
MCV: 90 fL (ref 79–97)
Monocytes Absolute: 0.6 10*3/uL (ref 0.1–0.9)
Monocytes: 9 %
Neutrophils Absolute: 3.4 10*3/uL (ref 1.4–7.0)
Neutrophils: 56 %
Platelets: 261 10*3/uL (ref 150–450)
RBC: 5.19 x10E6/uL (ref 4.14–5.80)
RDW: 13.6 % (ref 11.6–15.4)
WBC: 6.1 10*3/uL (ref 3.4–10.8)

## 2019-10-17 LAB — PSA TOTAL (REFLEX TO FREE): Prostate Specific Ag, Serum: 0.9 ng/mL (ref 0.0–4.0)

## 2019-10-17 LAB — VITAMIN D 25 HYDROXY (VIT D DEFICIENCY, FRACTURES): Vit D, 25-Hydroxy: 47.8 ng/mL (ref 30.0–100.0)

## 2019-10-24 ENCOUNTER — Ambulatory Visit (INDEPENDENT_AMBULATORY_CARE_PROVIDER_SITE_OTHER): Payer: Medicare Other | Admitting: Family Medicine

## 2019-10-24 ENCOUNTER — Other Ambulatory Visit: Payer: Self-pay

## 2019-10-24 ENCOUNTER — Encounter: Payer: Self-pay | Admitting: Family Medicine

## 2019-10-24 VITALS — BP 129/78 | HR 65 | Temp 98.6°F | Resp 20 | Ht 65.0 in | Wt 202.2 lb

## 2019-10-24 DIAGNOSIS — I10 Essential (primary) hypertension: Secondary | ICD-10-CM | POA: Diagnosis not present

## 2019-10-24 DIAGNOSIS — Z1211 Encounter for screening for malignant neoplasm of colon: Secondary | ICD-10-CM | POA: Diagnosis not present

## 2019-10-24 DIAGNOSIS — K2100 Gastro-esophageal reflux disease with esophagitis, without bleeding: Secondary | ICD-10-CM | POA: Diagnosis not present

## 2019-10-24 DIAGNOSIS — E782 Mixed hyperlipidemia: Secondary | ICD-10-CM | POA: Diagnosis not present

## 2019-10-24 DIAGNOSIS — G5603 Carpal tunnel syndrome, bilateral upper limbs: Secondary | ICD-10-CM | POA: Diagnosis not present

## 2019-10-24 DIAGNOSIS — E559 Vitamin D deficiency, unspecified: Secondary | ICD-10-CM

## 2019-10-24 DIAGNOSIS — Z1159 Encounter for screening for other viral diseases: Secondary | ICD-10-CM

## 2019-10-24 MED ORDER — LISINOPRIL 20 MG PO TABS: 20.0000 mg | ORAL_TABLET | Freq: Every day | ORAL | 3 refills | Status: DC

## 2019-10-24 MED ORDER — CO Q-10 100 MG PO CAPS: 100.0000 mg | ORAL_CAPSULE | Freq: Every day | ORAL | Status: DC

## 2019-10-24 MED ORDER — ROSUVASTATIN CALCIUM 5 MG PO TABS: 5.0000 mg | ORAL_TABLET | ORAL | 5 refills | Status: DC

## 2019-10-24 MED ORDER — FENOFIBRATE 160 MG PO TABS: 160.0000 mg | ORAL_TABLET | Freq: Every day | ORAL | 3 refills | Status: DC

## 2019-10-24 MED ORDER — VITAMIN D (ERGOCALCIFEROL) 1.25 MG (50000 UNIT) PO CAPS: 50000.0000 [IU] | ORAL_CAPSULE | ORAL | 3 refills | Status: DC

## 2019-10-24 MED ORDER — PANTOPRAZOLE SODIUM 40 MG PO TBEC: 40.0000 mg | DELAYED_RELEASE_TABLET | Freq: Every day | ORAL | 11 refills | Status: DC

## 2019-10-24 NOTE — Progress Notes (Signed)
Subjective:  Patient ID: Nathaniel Kelly, male    DOB: [birth year]  Age: 64 y.o. MRN: [id number]  CC: Follow-up (1 year, BP )   HPI Nathaniel Kelly presents for  follow-up of hypertension. Patient has no history of headache chest pain or shortness of breath or recent cough. Patient also denies symptoms of TIA such as focal numbness or weakness. Patient denies side effects from medication. States taking it regularly.  Follow-up cholesterol. Patient has been intolerant of multiple statins.  He stopped the Crestor after a few days.  He says it was doing severe damage to his joints.  He has tried Zetia in the past and had allergic reaction to it.  He says he is willing to try the Crestor again along with co-Q10 to see if that helps.  Patient suffers from frequent reflux.  He says the Protonix has never been tried but Prilosec gives him the runs and Nexium causes constipation.  Patient has a history of carpal tunnel syndrome.  He had a repair on the right, his dominant hand many years ago.  He said the pain is better but he still having numbness.  He says he is losing the use of the hands.  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    History Antwyne has a past medical history of Colon polyps, Diverticulosis, GERD (gastroesophageal reflux disease), Hyperlipemia, Hypertension, and Internal hemorrhoids without mention of complication (0000000).   He has a past surgical history that includes Hernia repair; Knee arthroscopy; Carpal tunnel release; Flexible sigmoidoscopy (N/A, 11/07/2012); and Hemorrhoid banding (N/A, 11/07/2012).   His family history includes Colon cancer in his paternal aunt and paternal grandmother; Heart disease in his maternal grandfather and paternal grandfather.He reports that he quit smoking about 33 years ago. He has never used smokeless tobacco. He reports current alcohol use. He reports that he does not use drugs.  Current Outpatient Medications on File Prior to  Visit  Medication Sig Dispense Refill  . fluocinonide cream (LIDEX) [id number] % Apply 1 application topically 2 (two) times daily. 120 g 3  . meloxicam (MOBIC) 15 MG tablet TAKE 1 TABLET BY MOUTH EVERY DAY FOR JOINT AND MUSCLE PAIN 30 tablet 5  . hydrocortisone (ANUSOL-HC) 25 MG suppository Place 1 suppository (25 mg total) rectally 4 (four) times daily. (Patient not taking: Reported on 10/24/2019) 56 suppository 5   No current facility-administered medications on file prior to visit.    ROS Review of Systems  Constitutional: Negative.   HENT: Negative.   Eyes: Negative for visual disturbance.  Respiratory: Negative for cough and shortness of breath.   Cardiovascular: Negative for chest pain and leg swelling.  Gastrointestinal: Negative for abdominal pain, diarrhea, nausea and vomiting.  Genitourinary: Negative for difficulty urinating.  Musculoskeletal: Positive for arthralgias. Negative for myalgias.  Skin: Negative for rash.  Neurological: Positive for numbness (Both hands primarily involving the fingers). Negative for headaches.  Psychiatric/Behavioral: Negative for sleep disturbance.    Objective:  BP 129/78   Pulse 65   Temp 98.6 F (37 C) (Oral)   Resp 20   Ht 5\' 5"  (1.651 m)   Wt 202 lb 3.2 oz (91.7 kg)   SpO2 94%   BMI 33.65 kg/m   BP Readings from Last 3 Encounters:  10/24/19 129/78  09/27/18 123/71  06/27/18 130/73    Wt Readings from Last 3 Encounters:  10/24/19 202 lb 3.2 oz (91.7 kg)  09/27/18 204 lb 8 oz (92.8 kg)  06/27/18  202 lb (91.6 kg)     Physical Exam Constitutional:      General: He is not in acute distress.    Appearance: He is well-developed.  HENT:     Head: Normocephalic and atraumatic.     Right Ear: External ear normal.     Left Ear: External ear normal.     Nose: Nose normal.  Eyes:     Conjunctiva/sclera: Conjunctivae normal.     Pupils: Pupils are equal, round, and reactive to light.  Cardiovascular:     Rate and Rhythm: Normal  rate and regular rhythm.     Heart sounds: Normal heart sounds. No murmur.  Pulmonary:     Effort: Pulmonary effort is normal. No respiratory distress.     Breath sounds: Normal breath sounds. No wheezing or rales.  Abdominal:     Palpations: Abdomen is soft.     Tenderness: There is no abdominal tenderness.  Musculoskeletal:        General: Normal range of motion.     Cervical back: Normal range of motion and neck supple.  Skin:    General: Skin is warm and dry.  Neurological:     Mental Status: He is alert and oriented to person, place, and time.     Motor: Weakness (4/5 strength of hands for grip.  Phalen test positive bilaterally) present.     Deep Tendon Reflexes: Reflexes are normal and symmetric.  Psychiatric:        Behavior: Behavior normal.        Thought Content: Thought content normal.        Judgment: Judgment normal.       Assessment & Plan:   Noar was seen today for follow-up.  Diagnoses and all orders for this visit:  Need for hepatitis C screening test -     Hepatitis C antibody  Screen for colon cancer -     Ambulatory referral to Gastroenterology  Essential hypertension -     lisinopril (ZESTRIL) 20 MG tablet; Take 1 tablet (20 mg total) by mouth daily. (Needs to be seen before next refill)  Mixed hyperlipidemia -     rosuvastatin (CRESTOR) 5 MG tablet; Take 1 tablet (5 mg total) by mouth every other day. For cholesterol -     fenofibrate 160 MG tablet; Take 1 tablet (160 mg total) by mouth daily. For cholesterol and triglyceride -     Coenzyme Q10 (CO Q-10) 100 MG CAPS; Take 100 mg by mouth daily.  Bilateral carpal tunnel syndrome  Vitamin D deficiency -     Vitamin D, Ergocalciferol, (DRISDOL) 1.25 MG (50000 UNIT) CAPS capsule; Take 1 capsule (50,000 Units total) by mouth every 7 (seven) days.  Gastroesophageal reflux disease with esophagitis without hemorrhage -     pantoprazole (PROTONIX) 40 MG tablet; Take 1 tablet (40 mg total) by mouth  daily. For stomach   Allergies as of 10/24/2019      Reactions   Atorvastatin Other (See Comments)   myalgia   Ezetimibe    Statins Other (See Comments)   Joint problems      Medication List       Accurate as of October 24, 2019  5:36 PM. If you have any questions, ask your nurse or doctor.        Co Q-10 100 MG Caps Take 100 mg by mouth daily. Started by: Claretta Fraise, MD   fenofibrate 160 MG tablet Take 1 tablet (160 mg total) by mouth daily.  For cholesterol and triglyceride Started by: Claretta Fraise, MD   fluocinonide cream 0.05 % Commonly known as: LIDEX Apply 1 application topically 2 (two) times daily.   hydrocortisone 25 MG suppository Commonly known as: ANUSOL-HC Place 1 suppository (25 mg total) rectally 4 (four) times daily.   lisinopril 20 MG tablet Commonly known as: ZESTRIL Take 1 tablet (20 mg total) by mouth daily. (Needs to be seen before next refill)   meloxicam 15 MG tablet Commonly known as: MOBIC TAKE 1 TABLET BY MOUTH EVERY DAY FOR JOINT AND MUSCLE PAIN   pantoprazole 40 MG tablet Commonly known as: PROTONIX Take 1 tablet (40 mg total) by mouth daily. For stomach Started by: Claretta Fraise, MD   rosuvastatin 5 MG tablet Commonly known as: Crestor Take 1 tablet (5 mg total) by mouth every other day. For cholesterol What changed:   medication strength  how much to take  when to take this Changed by: Claretta Fraise, MD   Vitamin D (Ergocalciferol) 1.25 MG (50000 UNIT) Caps capsule Commonly known as: DRISDOL Take 1 capsule (50,000 Units total) by mouth every 7 (seven) days.       Meds ordered this encounter  Medications  . rosuvastatin (CRESTOR) 5 MG tablet    Sig: Take 1 tablet (5 mg total) by mouth every other day. For cholesterol    Dispense:  15 tablet    Refill:  5  . fenofibrate 160 MG tablet    Sig: Take 1 tablet (160 mg total) by mouth daily. For cholesterol and triglyceride    Dispense:  90 tablet    Refill:  3  .  Coenzyme Q10 (CO Q-10) 100 MG CAPS    Sig: Take 100 mg by mouth daily.  . pantoprazole (PROTONIX) 40 MG tablet    Sig: Take 1 tablet (40 mg total) by mouth daily. For stomach    Dispense:  30 tablet    Refill:  11  . lisinopril (ZESTRIL) 20 MG tablet    Sig: Take 1 tablet (20 mg total) by mouth daily. (Needs to be seen before next refill)    Dispense:  90 tablet    Refill:  3  . Vitamin D, Ergocalciferol, (DRISDOL) 1.25 MG (50000 UNIT) CAPS capsule    Sig: Take 1 capsule (50,000 Units total) by mouth every 7 (seven) days.    Dispense:  13 capsule    Refill:  3    At this time the patient does not desire further evaluation of apparent carpal tunnel syndrome.  He will let me know if he makes up his mind to do that.  He says he went through a lot with the original surgery and he is reluctant is to pursue it as long as he knows that there is no severe danger to him.  With regard to the Protonix.  Of the Prilosec causing diarrhea while the Nexium caused constipation explained to believe that this is less likely a class effect and with his symptoms from reflux it is worthwhile to try the Protonix which is somewhat unique from those other 2.  Follow-up: Return in about 6 months (around 04/24/2020), or if symptoms worsen or fail to improve.  Claretta Fraise, M.D.

## 2019-10-25 ENCOUNTER — Encounter: Payer: Self-pay | Admitting: Gastroenterology

## 2019-10-25 LAB — HEPATITIS C ANTIBODY: Hep C Virus Ab: <0.1 s/co ratio (ref 0.0–0.9)

## 2019-10-27 ENCOUNTER — Other Ambulatory Visit: Payer: Self-pay | Admitting: Family Medicine

## 2019-11-27 ENCOUNTER — Other Ambulatory Visit: Payer: Self-pay

## 2019-11-27 ENCOUNTER — Ambulatory Visit (AMBULATORY_SURGERY_CENTER): Payer: Self-pay

## 2019-11-27 VITALS — Temp 98.0°F | Ht 65.0 in | Wt 206.8 lb

## 2019-11-27 DIAGNOSIS — Z1211 Encounter for screening for malignant neoplasm of colon: Secondary | ICD-10-CM

## 2019-11-27 NOTE — Progress Notes (Signed)
No allergies to soy or egg Pt is not on blood thinners or diet pills Denies issues with sedation/intubation Denies atrial flutter/fib Denies constipation   Emmi instructions given to pt  Pt is aware of Covid safety and care partner requirements.  

## 2019-12-08 ENCOUNTER — Other Ambulatory Visit: Payer: Self-pay | Admitting: Family Medicine

## 2019-12-08 ENCOUNTER — Telehealth: Payer: Self-pay | Admitting: Family Medicine

## 2019-12-08 DIAGNOSIS — E782 Mixed hyperlipidemia: Secondary | ICD-10-CM

## 2019-12-08 MED ORDER — ROSUVASTATIN CALCIUM 10 MG PO TABS: 10.0000 mg | ORAL_TABLET | ORAL | 1 refills | Status: DC

## 2019-12-08 NOTE — Telephone Encounter (Signed)
Aware of new rosuvastatin dose sent to pharmacy.

## 2019-12-08 NOTE — Telephone Encounter (Signed)
Patient is tolerating the rosuvastatin 5 mg daily as long as he takes the Q 10.  He is willing to try 10 mg if you will send in a new script?

## 2019-12-08 NOTE — Telephone Encounter (Signed)
I sent it in to [facility]

## 2019-12-11 ENCOUNTER — Ambulatory Visit (AMBULATORY_SURGERY_CENTER): Payer: Medicare Other | Admitting: Gastroenterology

## 2019-12-11 ENCOUNTER — Other Ambulatory Visit: Payer: Self-pay

## 2019-12-11 ENCOUNTER — Encounter: Payer: Self-pay | Admitting: Gastroenterology

## 2019-12-11 VITALS — BP 96/63 | HR 57 | Temp 97.1°F | Resp 10 | Ht 66.0 in | Wt 206.8 lb

## 2019-12-11 DIAGNOSIS — Z1211 Encounter for screening for malignant neoplasm of colon: Secondary | ICD-10-CM | POA: Diagnosis not present

## 2019-12-11 DIAGNOSIS — D128 Benign neoplasm of rectum: Secondary | ICD-10-CM | POA: Diagnosis not present

## 2019-12-11 DIAGNOSIS — D129 Benign neoplasm of anus and anal canal: Secondary | ICD-10-CM

## 2019-12-11 DIAGNOSIS — Z86010 Personal history of colonic polyps: Secondary | ICD-10-CM

## 2019-12-11 DIAGNOSIS — D122 Benign neoplasm of ascending colon: Secondary | ICD-10-CM | POA: Diagnosis not present

## 2019-12-11 MED ORDER — SODIUM CHLORIDE 0.9 % IV SOLN: 500.0000 mL | Freq: Once | INTRAVENOUS | Status: DC

## 2019-12-11 NOTE — Progress Notes (Signed)
Called to room to assist during endoscopic procedure.  Patient ID and intended procedure confirmed with present staff. Received instructions for my participation in the procedure from the performing physician.  

## 2019-12-11 NOTE — Op Note (Signed)
[facility] Patient Name: Nathaniel Kelly Procedure Date: 12/11/2019 7:26 AM MRN: [id number] Endoscopist: Mallie Mussel L. Loletha Carrow , MD Age: 64 Referring MD:  Date of Birth: 1956/04/11 Gender: Male Account #: [id number] Procedure:                Colonoscopy Indications:              Screening for colorectal malignant neoplasm (no                            polyps 08/2009) Medicines:                Monitored Anesthesia Care Procedure:                Pre-Anesthesia Assessment:                           - Prior to the procedure, a History and Physical                            was performed, and patient medications and                            allergies were reviewed. The patient's tolerance of                            previous anesthesia was also reviewed. The risks                            and benefits of the procedure and the sedation                            options and risks were discussed with the patient.                            All questions were answered, and informed consent                            was obtained. Prior Anticoagulants: The patient has                            taken no previous anticoagulant or antiplatelet                            agents except for aspirin. ASA Grade Assessment: II                            - A patient with mild systemic disease. After                            reviewing the risks and benefits, the patient was                            deemed in satisfactory condition to undergo the  procedure.                           After obtaining informed consent, the colonoscope                            was passed under direct vision. Throughout the                            procedure, the patient's blood pressure, pulse, and                            oxygen saturations were monitored continuously. The                            Colonoscope was introduced through the anus and   advanced to the the terminal ileum, with                            identification of the appendiceal orifice and IC                            valve. The colonoscopy was performed without                            difficulty. The patient tolerated the procedure                            well. The quality of the bowel preparation was                            good. The terminal ileum, ileocecal valve,                            appendiceal orifice, and rectum were photographed.                            The bowel preparation used was Miralax. Scope In: 8:11:14 AM Scope Out: 8:28:15 AM Scope Withdrawal Time: 0 hours 13 minutes 23 seconds  Total Procedure Duration: 0 hours 17 minutes 1 second  Findings:                 The perianal and digital rectal examinations were                            normal.                           The terminal ileum appeared normal.                           Two sessile polyps were found in the rectum and                            ascending colon. The polyps were 4 to 8 mm in size.  These polyps were removed with a cold snare.                            Resection and retrieval were complete.                           Multiple diverticula were found in the left colon                            and right colon.                           Internal hemorrhoids were found.                           The exam was otherwise without abnormality on                            direct and retroflexion views. Complications:            No immediate complications. Estimated Blood Loss:     Estimated blood loss was minimal. Impression:               - The examined portion of the ileum was normal.                           - Two 4 to 8 mm polyps in the rectum and in the                            ascending colon, removed with a cold snare.                            Resected and retrieved.                           - Diverticulosis in the left  colon and in the right                            colon.                           - Internal hemorrhoids.                           - The examination was otherwise normal on direct                            and retroflexion views. Recommendation:           - Patient has a contact number available for                            emergencies. The signs and symptoms of potential                            delayed complications were discussed with the  patient. Return to normal activities tomorrow.                            Written discharge instructions were provided to the                            patient.                           - Resume previous diet.                           - Continue present medications.                           - Await pathology results.                           - Repeat colonoscopy is recommended for                            surveillance. The colonoscopy date will be                            determined after pathology results from today's                            exam become available for review.                           - Return to my office as needed if desires                            hemorrhoidal banding for intermittent prolapse and                            bleeding. Chelcee Korpi L. Loletha Carrow, MD 12/11/2019 8:34:22 AM This report has been signed electronically.

## 2019-12-11 NOTE — Progress Notes (Signed)
jb - check in Dt - vs

## 2019-12-11 NOTE — Progress Notes (Signed)
To PACU, VSS. Report to [email or web address] 

## 2019-12-11 NOTE — Patient Instructions (Signed)
Handouts Provided:  Polyps, Diverticulosis, and Hemorrhoid banding.   YOU HAD AN ENDOSCOPIC PROCEDURE TODAY AT [facility]:   Refer to the procedure report that was given to you for any specific questions about what was found during the examination.  If the procedure report does not answer your questions, please call your gastroenterologist to clarify.  If you requested that your care partner not be given the details of your procedure findings, then the procedure report has been included in a sealed envelope for you to review at your convenience later.  YOU SHOULD EXPECT: Some feelings of bloating in the abdomen. Passage of more gas than usual.  Walking can help get rid of the air that was put into your GI tract during the procedure and reduce the bloating. If you had a lower endoscopy (such as a colonoscopy or flexible sigmoidoscopy) you may notice spotting of blood in your stool or on the toilet paper. If you underwent a bowel prep for your procedure, you may not have a normal bowel movement for a few days.  Please Note:  You might notice some irritation and congestion in your nose or some drainage.  This is from the oxygen used during your procedure.  There is no need for concern and it should clear up in a day or so.  SYMPTOMS TO REPORT IMMEDIATELY:   Following lower endoscopy (colonoscopy or flexible sigmoidoscopy):  Excessive amounts of blood in the stool  Significant tenderness or worsening of abdominal pains  Swelling of the abdomen that is new, acute  Fever of 100F or higher  For urgent or emergent issues, a gastroenterologist can be reached at any hour by calling [phone]. Do not use MyChart messaging for urgent concerns.    DIET:  We do recommend a small meal at first, but then you may proceed to your regular diet.  Drink plenty of fluids but you should avoid alcoholic beverages for 24 hours.  ACTIVITY:  You should plan to take it easy for the rest of today  and you should NOT DRIVE or use heavy machinery until tomorrow (because of the sedation medicines used during the test).    FOLLOW UP: Our staff will call the number listed on your records 48-72 hours following your procedure to check on you and address any questions or concerns that you may have regarding the information given to you following your procedure. If we do not reach you, we will leave a message.  We will attempt to reach you two times.  During this call, we will ask if you have developed any symptoms of COVID 19. If you develop any symptoms (ie: fever, flu-like symptoms, shortness of breath, cough etc.) before then, please call [phone].  If you test positive for Covid 19 in the 2 weeks post procedure, please call and report this information to [place].    If any biopsies were taken you will be contacted by phone or by letter within the next 1-3 weeks.  Please call us at [phone] if you have not heard about the biopsies in 3 weeks.    SIGNATURES/CONFIDENTIALITY: You and/or your care partner have signed paperwork which will be entered into your electronic medical record.  These signatures attest to the fact that that the information above on your After Visit Summary has been reviewed and is understood.  Full responsibility of the confidentiality of this discharge information lies with you and/or your care-partner.

## 2019-12-13 ENCOUNTER — Telehealth: Payer: Self-pay

## 2019-12-13 NOTE — Telephone Encounter (Signed)
  Follow up Call-  Call back number 12/11/2019  Post procedure Call Back phone  # [phone]  Permission to leave phone message Yes  Some recent data might be hidden     Patient questions:  Do you have a fever, pain , or abdominal swelling? No. Pain Score  0 *  Have you tolerated food without any problems? Yes.    Have you been able to return to your normal activities? Yes.    Do you have any questions about your discharge instructions: Diet   No. Medications  No. Follow up visit  No.  Do you have questions or concerns about your Care? No.  Actions: * If pain score is 4 or above: No action needed, pain <4. 1. Have you developed a fever since your procedure? no  2.   Have you had an respiratory symptoms (SOB or cough) since your procedure? no  3.   Have you tested positive for COVID 19 since your procedure no  4.   Have you had any family members/close contacts diagnosed with the COVID 19 since your procedure?  no   If yes to any of these questions please route to Joylene John, RN and Erenest Rasher, RN

## 2019-12-22 ENCOUNTER — Encounter: Payer: Self-pay | Admitting: Gastroenterology

## 2020-01-19 ENCOUNTER — Other Ambulatory Visit: Payer: Self-pay | Admitting: Family Medicine

## 2020-02-16 ENCOUNTER — Other Ambulatory Visit: Payer: Self-pay | Admitting: Family Medicine

## 2020-03-13 ENCOUNTER — Other Ambulatory Visit: Payer: Self-pay | Admitting: Family Medicine

## 2020-04-09 ENCOUNTER — Other Ambulatory Visit: Payer: Self-pay | Admitting: Family Medicine

## 2020-04-19 ENCOUNTER — Other Ambulatory Visit: Payer: Self-pay | Admitting: Family Medicine

## 2020-04-19 DIAGNOSIS — E782 Mixed hyperlipidemia: Secondary | ICD-10-CM

## 2020-05-07 ENCOUNTER — Other Ambulatory Visit: Payer: Self-pay | Admitting: Family Medicine

## 2020-05-14 DIAGNOSIS — Z23 Encounter for immunization: Secondary | ICD-10-CM | POA: Diagnosis not present

## 2020-10-17 ENCOUNTER — Other Ambulatory Visit: Payer: Self-pay | Admitting: Family Medicine

## 2020-10-17 DIAGNOSIS — I10 Essential (primary) hypertension: Secondary | ICD-10-CM

## 2020-10-19 ENCOUNTER — Other Ambulatory Visit: Payer: Self-pay | Admitting: Family Medicine

## 2020-10-19 DIAGNOSIS — E559 Vitamin D deficiency, unspecified: Secondary | ICD-10-CM

## 2020-10-19 DIAGNOSIS — E782 Mixed hyperlipidemia: Secondary | ICD-10-CM

## 2020-10-19 DIAGNOSIS — K2100 Gastro-esophageal reflux disease with esophagitis, without bleeding: Secondary | ICD-10-CM

## 2020-11-05 ENCOUNTER — Other Ambulatory Visit: Payer: Self-pay | Admitting: *Deleted

## 2020-11-05 ENCOUNTER — Other Ambulatory Visit: Payer: Medicare Other

## 2020-11-05 ENCOUNTER — Other Ambulatory Visit: Payer: Self-pay

## 2020-11-05 DIAGNOSIS — E782 Mixed hyperlipidemia: Secondary | ICD-10-CM | POA: Diagnosis not present

## 2020-11-05 DIAGNOSIS — Z125 Encounter for screening for malignant neoplasm of prostate: Secondary | ICD-10-CM

## 2020-11-05 DIAGNOSIS — I10 Essential (primary) hypertension: Secondary | ICD-10-CM

## 2020-11-05 DIAGNOSIS — E559 Vitamin D deficiency, unspecified: Secondary | ICD-10-CM

## 2020-11-06 LAB — CMP14+EGFR
ALT: 10 IU/L (ref 0–44)
AST: 24 IU/L (ref 0–40)
Albumin/Globulin Ratio: 1.9 (ref 1.2–2.2)
Albumin: 4.2 g/dL (ref 3.8–4.8)
Alkaline Phosphatase: 51 IU/L (ref 44–121)
BUN/Creatinine Ratio: 16 (ref 10–24)
BUN: 19 mg/dL (ref 8–27)
Bilirubin Total: 0.6 mg/dL (ref 0.0–1.2)
CO2: 23 mmol/L (ref 20–29)
Calcium: 9.6 mg/dL (ref 8.6–10.2)
Chloride: 101 mmol/L (ref 96–106)
Creatinine, Ser: 1.2 mg/dL (ref 0.76–1.27)
Globulin, Total: 2.2 g/dL (ref 1.5–4.5)
Glucose: 91 mg/dL (ref 65–99)
Potassium: 4.5 mmol/L (ref 3.5–5.2)
Sodium: 139 mmol/L (ref 134–144)
Total Protein: 6.4 g/dL (ref 6.0–8.5)
eGFR: 66 mL/min/{1.73_m2} (ref >59)

## 2020-11-06 LAB — CBC WITH DIFFERENTIAL/PLATELET
Basophils Absolute: 0 10*3/uL (ref 0.0–0.2)
Basos: 1 %
EOS (ABSOLUTE): 0.1 10*3/uL (ref 0.0–0.4)
Eos: 2 %
Hematocrit: 42.2 % (ref 37.5–51.0)
Hemoglobin: 14.5 g/dL (ref 13.0–17.7)
Immature Grans (Abs): 0 10*3/uL (ref 0.0–0.1)
Immature Granulocytes: 0 %
Lymphocytes Absolute: 1.1 10*3/uL (ref 0.7–3.1)
Lymphs: 23 %
MCH: 31.7 pg (ref 26.6–33.0)
MCHC: 34.4 g/dL (ref 31.5–35.7)
MCV: 92 fL (ref 79–97)
Monocytes Absolute: 0.4 10*3/uL (ref 0.1–0.9)
Monocytes: 9 %
Neutrophils Absolute: 3.1 10*3/uL (ref 1.4–7.0)
Neutrophils: 65 %
Platelets: 216 10*3/uL (ref 150–450)
RBC: 4.57 x10E6/uL (ref 4.14–5.80)
RDW: 12.2 % (ref 11.6–15.4)
WBC: 4.7 10*3/uL (ref 3.4–10.8)

## 2020-11-06 LAB — LIPID PANEL
Chol/HDL Ratio: 3.2 ratio (ref 0.0–5.0)
Cholesterol, Total: 203 mg/dL — ABNORMAL HIGH (ref 100–199)
HDL: 63 mg/dL (ref >39)
LDL Chol Calc (NIH): 129 mg/dL — ABNORMAL HIGH (ref 0–99)
Triglycerides: 59 mg/dL (ref 0–149)
VLDL Cholesterol Cal: 11 mg/dL (ref 5–40)

## 2020-11-06 LAB — VITAMIN D 25 HYDROXY (VIT D DEFICIENCY, FRACTURES): Vit D, 25-Hydroxy: 50.1 ng/mL (ref 30.0–100.0)

## 2020-11-09 ENCOUNTER — Other Ambulatory Visit: Payer: Self-pay | Admitting: Family Medicine

## 2020-11-09 DIAGNOSIS — I10 Essential (primary) hypertension: Secondary | ICD-10-CM

## 2020-11-11 ENCOUNTER — Other Ambulatory Visit: Payer: Self-pay | Admitting: Family Medicine

## 2020-11-11 DIAGNOSIS — E559 Vitamin D deficiency, unspecified: Secondary | ICD-10-CM

## 2020-11-13 ENCOUNTER — Ambulatory Visit (INDEPENDENT_AMBULATORY_CARE_PROVIDER_SITE_OTHER): Payer: Medicare Other | Admitting: Family Medicine

## 2020-11-13 ENCOUNTER — Other Ambulatory Visit: Payer: Self-pay

## 2020-11-13 ENCOUNTER — Encounter: Payer: Self-pay | Admitting: Family Medicine

## 2020-11-13 ENCOUNTER — Other Ambulatory Visit: Payer: Self-pay | Admitting: Family Medicine

## 2020-11-13 VITALS — BP 104/64 | HR 60 | Temp 97.4°F | Ht 66.0 in | Wt 203.0 lb

## 2020-11-13 DIAGNOSIS — I10 Essential (primary) hypertension: Secondary | ICD-10-CM

## 2020-11-13 DIAGNOSIS — K2100 Gastro-esophageal reflux disease with esophagitis, without bleeding: Secondary | ICD-10-CM

## 2020-11-13 DIAGNOSIS — E782 Mixed hyperlipidemia: Secondary | ICD-10-CM | POA: Diagnosis not present

## 2020-11-13 DIAGNOSIS — N400 Enlarged prostate without lower urinary tract symptoms: Secondary | ICD-10-CM

## 2020-11-13 DIAGNOSIS — Z23 Encounter for immunization: Secondary | ICD-10-CM

## 2020-11-13 DIAGNOSIS — Z125 Encounter for screening for malignant neoplasm of prostate: Secondary | ICD-10-CM

## 2020-11-13 DIAGNOSIS — E559 Vitamin D deficiency, unspecified: Secondary | ICD-10-CM | POA: Diagnosis not present

## 2020-11-13 MED ORDER — ROSUVASTATIN CALCIUM 10 MG PO TABS: 10.0000 mg | ORAL_TABLET | Freq: Every day | ORAL | 1 refills | Status: DC

## 2020-11-13 MED ORDER — LISINOPRIL 20 MG PO TABS: 20.0000 mg | ORAL_TABLET | Freq: Every day | ORAL | 1 refills | Status: DC

## 2020-11-13 MED ORDER — MELOXICAM 15 MG PO TABS: 1.0000 | ORAL_TABLET | Freq: Every day | ORAL | 1 refills | Status: DC

## 2020-11-13 MED ORDER — VITAMIN D (ERGOCALCIFEROL) 1.25 MG (50000 UNIT) PO CAPS: ORAL_CAPSULE | ORAL | 1 refills | Status: DC

## 2020-11-13 MED ORDER — PANTOPRAZOLE SODIUM 40 MG PO TBEC: 40.0000 mg | DELAYED_RELEASE_TABLET | Freq: Every day | ORAL | 3 refills | Status: DC

## 2020-11-13 NOTE — Addendum Note (Signed)
Addended by: Claretta Fraise on: 11/13/2020 02:46 PM   Modules accepted: Orders

## 2020-11-13 NOTE — Progress Notes (Addendum)
 Subjective:  Patient ID: Nathaniel Kelly, male    DOB: 12/21/1955  Age: 65 y.o. MRN: [id number]  CC: Medication Refill   HPI Nathaniel Kelly presents for  follow-up of hypertension. Patient has no history of headache chest pain or shortness of breath or recent cough. Patient also denies symptoms of TIA such as focal numbness or weakness. Patient denies side effects from medication. States taking it regularly. On the treadmill for an hour   in for follow-up of elevated cholesterol. Doing well without complaints on current medication. Denies side effects of statin including myalgia and arthralgia and nausea. Currently no chest pain, shortness of breath or other cardiovascular related symptoms noted.  Patient in for follow-up of GERD. Currently asymptomatic taking  PPI daily. There is no chest pain or heartburn. No hematemesis and no melena. No dysphagia or choking. Onset is remote. Progression is stable. Complicating factors, none.  Also in for refil of vitamin D. Labs drawn on 4/19 History Nathaniel Kelly has a past medical history of Colon polyps, Diverticulosis, GERD (gastroesophageal reflux disease), Hyperlipemia, Hypertension, and Internal hemorrhoids without mention of complication (10/19/2012).   He has a past surgical history that includes Hernia repair; Knee arthroscopy; Carpal tunnel release; Flexible sigmoidoscopy (N/A, 11/07/2012); Hemorrhoid banding (N/A, 11/07/2012); and Colonoscopy (2011).   His family history includes Colon cancer in his paternal aunt and paternal grandmother; Heart disease in his maternal grandfather and paternal grandfather.He reports that he quit smoking about 34 years ago. He has never used smokeless tobacco. He reports current alcohol use. He reports that he does not use drugs.  Current Outpatient Medications on File Prior to Visit  Medication Sig Dispense Refill  . aspirin EC 81 MG tablet Take 81 mg by mouth daily.    . fluocinonide cream (LIDEX) 0.05 % Apply 1 application  topically 2 (two) times daily. 120 g 3  . hydrocortisone (ANUSOL-HC) 25 MG suppository PLACE 1 SUPPOSITORY (25 MG TOTAL) RECTALLY 4 (FOUR) TIMES DAILY. 56 suppository 5   No current facility-administered medications on file prior to visit.    ROS Review of Systems  Constitutional: Negative for fever.  Respiratory: Negative for shortness of breath.   Cardiovascular: Negative for chest pain.  Musculoskeletal: Positive for arthralgias.  Skin: Negative for rash.    Objective:  BP 104/64   Pulse 60   Temp (!) 97.4 F (36.3 C)   Ht 5' 6" (1.676 m)   Wt 203 lb (92.1 kg)   SpO2 98%   BMI 32.77 kg/m   BP Readings from Last 3 Encounters:  11/13/20 104/64  12/11/19 96/63  10/24/19 129/78    Wt Readings from Last 3 Encounters:  11/13/20 203 lb (92.1 kg)  12/11/19 206 lb 12.8 oz (93.8 kg)  11/27/19 206 lb 12.8 oz (93.8 kg)     Physical Exam Vitals reviewed.  Constitutional:      Appearance: He is well-developed.  HENT:     Head: Normocephalic and atraumatic.     Right Ear: Tympanic membrane and external ear normal. No decreased hearing noted.     Left Ear: Tympanic membrane and external ear normal. No decreased hearing noted.     Mouth/Throat:     Pharynx: No oropharyngeal exudate or posterior oropharyngeal erythema.  Eyes:     Pupils: Pupils are equal, round, and reactive to light.  Cardiovascular:     Rate and Rhythm: Normal rate and regular rhythm.     Heart sounds: No murmur heard.   Pulmonary:       Effort: No respiratory distress.     Breath sounds: Normal breath sounds.  Abdominal:     General: Bowel sounds are normal.     Palpations: Abdomen is soft. There is no mass.     Tenderness: There is no abdominal tenderness.  Musculoskeletal:     Cervical back: Normal range of motion and neck supple.     Results for orders placed or performed in visit on 11/05/20  VITAMIN D 25 Hydroxy (Vit-D Deficiency, Fractures)  Result Value Ref Range   Vit D, 25-Hydroxy  50.1 30.0 - 100.0 ng/mL  Lipid panel  Result Value Ref Range   Cholesterol, Total 203 (H) 100 - 199 mg/dL   Triglycerides 59 0 - 149 mg/dL   HDL 63 >39 mg/dL   VLDL Cholesterol Cal 11 5 - 40 mg/dL   LDL Chol Calc (NIH) 129 (H) 0 - 99 mg/dL   Chol/HDL Ratio 3.2 0.0 - 5.0 ratio  CMP14+EGFR  Result Value Ref Range   Glucose 91 65 - 99 mg/dL   BUN 19 8 - 27 mg/dL   Creatinine, Ser 1.20 0.76 - 1.27 mg/dL   eGFR 66 >59 mL/min/1.73   BUN/Creatinine Ratio 16 10 - 24   Sodium 139 134 - 144 mmol/L   Potassium 4.5 3.5 - 5.2 mmol/L   Chloride 101 96 - 106 mmol/L   CO2 23 20 - 29 mmol/L   Calcium 9.6 8.6 - 10.2 mg/dL   Total Protein 6.4 6.0 - 8.5 g/dL   Albumin 4.2 3.8 - 4.8 g/dL   Globulin, Total 2.2 1.5 - 4.5 g/dL   Albumin/Globulin Ratio 1.9 1.2 - 2.2   Bilirubin Total 0.6 0.0 - 1.2 mg/dL   Alkaline Phosphatase 51 44 - 121 IU/L   AST 24 0 - 40 IU/L   ALT 10 0 - 44 IU/L  CBC with Differential/Platelet  Result Value Ref Range   WBC 4.7 3.4 - 10.8 x10E3/uL   RBC 4.57 4.14 - 5.80 x10E6/uL   Hemoglobin 14.5 13.0 - 17.7 g/dL   Hematocrit 42.2 37.5 - 51.0 %   MCV 92 79 - 97 fL   MCH 31.7 26.6 - 33.0 pg   MCHC 34.4 31.5 - 35.7 g/dL   RDW 12.2 11.6 - 15.4 %   Platelets 216 150 - 450 x10E3/uL   Neutrophils 65 Not Estab. %   Lymphs 23 Not Estab. %   Monocytes 9 Not Estab. %   Eos 2 Not Estab. %   Basos 1 Not Estab. %   Neutrophils Absolute 3.1 1.4 - 7.0 x10E3/uL   Lymphocytes Absolute 1.1 0.7 - 3.1 x10E3/uL   Monocytes Absolute 0.4 0.1 - 0.9 x10E3/uL   EOS (ABSOLUTE) 0.1 0.0 - 0.4 x10E3/uL   Basophils Absolute 0.0 0.0 - 0.2 x10E3/uL   Immature Granulocytes 0 Not Estab. %   Immature Grans (Abs) 0.0 0.0 - 0.1 x10E3/uL     Assessment & Plan:   Nathaniel Kelly was seen today for medication refill.  Diagnoses and all orders for this visit:  Prostate cancer screening  Mixed hyperlipidemia -     rosuvastatin (CRESTOR) 10 MG tablet; Take 1 tablet (10 mg total) by mouth daily. For  cholesterol  Vitamin D deficiency -     Vitamin D, Ergocalciferol, (DRISDOL) 1.25 MG (50000 UNIT) CAPS capsule; TAKE 1 CAPSULE BY MOUTH EVERY 7 DAYS.  Gastroesophageal reflux disease with esophagitis without hemorrhage -     pantoprazole (PROTONIX) 40 MG tablet; Take 1 tablet (40 mg total) by   mouth daily.  Essential hypertension -     lisinopril (ZESTRIL) 20 MG tablet; Take 1 tablet (20 mg total) by mouth daily.  Screening for prostate cancer -     PSA, total and free  Benign prostatic hyperplasia without lower urinary tract symptoms -     PSA, total and free  Other orders -     meloxicam (MOBIC) 15 MG tablet; Take 1 tablet (15 mg total) by mouth daily.   Allergies as of 11/13/2020      Reactions   Atorvastatin Other (See Comments)   myalgia   Ezetimibe    Statins Other (See Comments)   Joint problems      Medication List       Accurate as of November 13, 2020  2:46 PM. If you have any questions, ask your nurse or doctor.        STOP taking these medications   Co Q-10 100 MG Caps Stopped by: Claretta Fraise, MD   fenofibrate 160 MG tablet Stopped by: Claretta Fraise, MD     TAKE these medications   aspirin EC 81 MG tablet Take 81 mg by mouth daily.   fluocinonide cream 0.05 % Commonly known as: LIDEX Apply 1 application topically 2 (two) times daily.   hydrocortisone 25 MG suppository Commonly known as: ANUSOL-HC PLACE 1 SUPPOSITORY (25 MG TOTAL) RECTALLY 4 (FOUR) TIMES DAILY.   lisinopril 20 MG tablet Commonly known as: ZESTRIL Take 1 tablet (20 mg total) by mouth daily.   meloxicam 15 MG tablet Commonly known as: MOBIC Take 1 tablet (15 mg total) by mouth daily.   pantoprazole 40 MG tablet Commonly known as: PROTONIX Take 1 tablet (40 mg total) by mouth daily. What changed: additional instructions Changed by: Claretta Fraise, MD   rosuvastatin 10 MG tablet Commonly known as: Crestor Take 1 tablet (10 mg total) by mouth daily. For cholesterol What  changed: when to take this Changed by: Claretta Fraise, MD   Vitamin D (Ergocalciferol) 1.25 MG (50000 UNIT) Caps capsule Commonly known as: DRISDOL TAKE 1 CAPSULE BY MOUTH EVERY 7 DAYS. What changed: See the new instructions. Changed by: Claretta Fraise, MD       Meds ordered this encounter  Medications  . Vitamin D, Ergocalciferol, (DRISDOL) 1.25 MG (50000 UNIT) CAPS capsule    Sig: TAKE 1 CAPSULE BY MOUTH EVERY 7 DAYS.    Dispense:  13 capsule    Refill:  1  . rosuvastatin (CRESTOR) 10 MG tablet    Sig: Take 1 tablet (10 mg total) by mouth daily. For cholesterol    Dispense:  90 tablet    Refill:  1  . pantoprazole (PROTONIX) 40 MG tablet    Sig: Take 1 tablet (40 mg total) by mouth daily.    Dispense:  90 tablet    Refill:  3  . lisinopril (ZESTRIL) 20 MG tablet    Sig: Take 1 tablet (20 mg total) by mouth daily.    Dispense:  90 tablet    Refill:  1  . meloxicam (MOBIC) 15 MG tablet    Sig: Take 1 tablet (15 mg total) by mouth daily.    Dispense:  90 tablet    Refill:  1      Follow-up: Return in about 6 months (around 05/15/2021).  Claretta Fraise, M.D.

## 2020-11-13 NOTE — Addendum Note (Signed)
Addended by: Claretta Fraise on: 11/13/2020 02:21 PM   Modules accepted: Orders

## 2020-11-13 NOTE — Addendum Note (Signed)
Addended by: Baldomero Lamy B on: 11/13/2020 02:51 PM   Modules accepted: Orders

## 2020-11-14 LAB — PSA, TOTAL AND FREE
PSA, Free Pct: 31.3 %
PSA, Free: 0.25 ng/mL
Prostate Specific Ag, Serum: 0.8 ng/mL (ref 0.0–4.0)

## 2020-12-07 ENCOUNTER — Other Ambulatory Visit: Payer: Self-pay | Admitting: Family Medicine

## 2020-12-07 DIAGNOSIS — E782 Mixed hyperlipidemia: Secondary | ICD-10-CM

## 2021-01-22 ENCOUNTER — Other Ambulatory Visit: Payer: Self-pay | Admitting: Family Medicine

## 2021-01-22 DIAGNOSIS — E782 Mixed hyperlipidemia: Secondary | ICD-10-CM

## 2021-04-23 ENCOUNTER — Telehealth: Payer: Self-pay | Admitting: Family Medicine

## 2021-04-23 NOTE — Telephone Encounter (Signed)
Pt has appt to see Dr Livia Snellen on 11/14 for check up. Says he will come by office on 11/11 to do lab work. Please put in lab order.

## 2021-04-24 DIAGNOSIS — Z23 Encounter for immunization: Secondary | ICD-10-CM | POA: Diagnosis not present

## 2021-04-30 ENCOUNTER — Other Ambulatory Visit: Payer: Self-pay | Admitting: Family Medicine

## 2021-04-30 DIAGNOSIS — I10 Essential (primary) hypertension: Secondary | ICD-10-CM

## 2021-04-30 DIAGNOSIS — E782 Mixed hyperlipidemia: Secondary | ICD-10-CM

## 2021-04-30 NOTE — Telephone Encounter (Signed)
I entered his lab orders

## 2021-05-03 ENCOUNTER — Other Ambulatory Visit: Payer: Self-pay | Admitting: Family Medicine

## 2021-05-03 DIAGNOSIS — I10 Essential (primary) hypertension: Secondary | ICD-10-CM

## 2021-05-03 DIAGNOSIS — E559 Vitamin D deficiency, unspecified: Secondary | ICD-10-CM

## 2021-05-10 ENCOUNTER — Other Ambulatory Visit: Payer: Self-pay | Admitting: Family Medicine

## 2021-05-10 DIAGNOSIS — E782 Mixed hyperlipidemia: Secondary | ICD-10-CM

## 2021-05-15 ENCOUNTER — Ambulatory Visit: Payer: Medicare Other | Admitting: Family Medicine

## 2021-05-30 ENCOUNTER — Other Ambulatory Visit: Payer: Medicare Other

## 2021-05-30 ENCOUNTER — Other Ambulatory Visit: Payer: Self-pay

## 2021-05-30 DIAGNOSIS — E782 Mixed hyperlipidemia: Secondary | ICD-10-CM | POA: Diagnosis not present

## 2021-05-30 DIAGNOSIS — I10 Essential (primary) hypertension: Secondary | ICD-10-CM | POA: Diagnosis not present

## 2021-05-30 LAB — LIPID PANEL
Chol/HDL Ratio: 2.8 ratio (ref 0.0–5.0)
Cholesterol, Total: 192 mg/dL (ref 100–199)
HDL: 69 mg/dL (ref >39)
LDL Chol Calc (NIH): 110 mg/dL — ABNORMAL HIGH (ref 0–99)
Triglycerides: 70 mg/dL (ref 0–149)
VLDL Cholesterol Cal: 13 mg/dL (ref 5–40)

## 2021-05-30 LAB — CBC WITH DIFFERENTIAL/PLATELET
Basophils Absolute: 0.1 10*3/uL (ref 0.0–0.2)
Basos: 1 %
EOS (ABSOLUTE): 0.1 10*3/uL (ref 0.0–0.4)
Eos: 2 %
Hematocrit: 46 % (ref 37.5–51.0)
Hemoglobin: 15.2 g/dL (ref 13.0–17.7)
Immature Grans (Abs): 0 10*3/uL (ref 0.0–0.1)
Immature Granulocytes: 0 %
Lymphocytes Absolute: 1.3 10*3/uL (ref 0.7–3.1)
Lymphs: 27 %
MCH: 30.8 pg (ref 26.6–33.0)
MCHC: 33 g/dL (ref 31.5–35.7)
MCV: 93 fL (ref 79–97)
Monocytes Absolute: 0.5 10*3/uL (ref 0.1–0.9)
Monocytes: 10 %
Neutrophils Absolute: 3 10*3/uL (ref 1.4–7.0)
Neutrophils: 60 %
Platelets: 207 10*3/uL (ref 150–450)
RBC: 4.93 x10E6/uL (ref 4.14–5.80)
RDW: 12.6 % (ref 11.6–15.4)
WBC: 5 10*3/uL (ref 3.4–10.8)

## 2021-05-30 LAB — CMP14+EGFR
ALT: 10 IU/L (ref 0–44)
AST: 22 IU/L (ref 0–40)
Albumin/Globulin Ratio: 2 (ref 1.2–2.2)
Albumin: 4.2 g/dL (ref 3.8–4.8)
Alkaline Phosphatase: 63 IU/L (ref 44–121)
BUN/Creatinine Ratio: 18 (ref 10–24)
BUN: 19 mg/dL (ref 8–27)
Bilirubin Total: 1.4 mg/dL — ABNORMAL HIGH (ref 0.0–1.2)
CO2: 26 mmol/L (ref 20–29)
Calcium: 9.4 mg/dL (ref 8.6–10.2)
Chloride: 101 mmol/L (ref 96–106)
Creatinine, Ser: 1.03 mg/dL (ref 0.76–1.27)
Globulin, Total: 2.1 g/dL (ref 1.5–4.5)
Glucose: 99 mg/dL (ref 70–99)
Potassium: 4.4 mmol/L (ref 3.5–5.2)
Sodium: 138 mmol/L (ref 134–144)
Total Protein: 6.3 g/dL (ref 6.0–8.5)
eGFR: 80 mL/min/{1.73_m2} (ref >59)

## 2021-06-02 ENCOUNTER — Ambulatory Visit (INDEPENDENT_AMBULATORY_CARE_PROVIDER_SITE_OTHER): Payer: Medicare Other | Admitting: Family Medicine

## 2021-06-02 ENCOUNTER — Other Ambulatory Visit: Payer: Self-pay

## 2021-06-02 ENCOUNTER — Encounter: Payer: Self-pay | Admitting: Family Medicine

## 2021-06-02 VITALS — HR 53 | Temp 96.6°F | Ht 66.0 in | Wt 195.8 lb

## 2021-06-02 DIAGNOSIS — E559 Vitamin D deficiency, unspecified: Secondary | ICD-10-CM

## 2021-06-02 DIAGNOSIS — I10 Essential (primary) hypertension: Secondary | ICD-10-CM | POA: Diagnosis not present

## 2021-06-02 DIAGNOSIS — K2100 Gastro-esophageal reflux disease with esophagitis, without bleeding: Secondary | ICD-10-CM | POA: Diagnosis not present

## 2021-06-02 DIAGNOSIS — E782 Mixed hyperlipidemia: Secondary | ICD-10-CM

## 2021-06-02 MED ORDER — VITAMIN D (ERGOCALCIFEROL) 1.25 MG (50000 UNIT) PO CAPS: ORAL_CAPSULE | ORAL | 3 refills | Status: DC

## 2021-06-02 MED ORDER — LISINOPRIL 20 MG PO TABS: 20.0000 mg | ORAL_TABLET | Freq: Every day | ORAL | 3 refills | Status: DC

## 2021-06-02 MED ORDER — ROSUVASTATIN CALCIUM 10 MG PO TABS: ORAL_TABLET | ORAL | 2 refills | Status: DC

## 2021-06-02 NOTE — Progress Notes (Signed)
Subjective:  Patient ID: Nathaniel Kelly, male    DOB: 1955/08/25  Age: 65 y.o. MRN: [id number]  CC: Medical Management of Chronic Issues   HPI Edrik Rundle presents for follow-up of elevated cholesterol. Doing well without complaints on current medication. Denies side effects of statin including myalgia and nausea. He reports right elbow pain that is relieved by a "Gummie" every 2-3 days. Also in today for liver function testreview. Concerned about T. Bili of1.4. Currently no chest pain, shortness of breath or other cardiovascular related symptoms noted.   presents for  follow-up of hypertension. Patient has no history of headache chest pain or shortness of breath or recent cough. Patient also denies symptoms of TIA such as focal numbness or weakness. Patient denies side effects from medication. States taking it regularly.   Has been avoiding bread and reflux is gone. Was able to DC pantoprazole.  History Felice has a past medical history of Colon polyps, Diverticulosis, GERD (gastroesophageal reflux disease), Hyperlipemia, Hypertension, and Internal hemorrhoids without mention of complication (0/07/7508).   He has a past surgical history that includes Hernia repair; Knee arthroscopy; Carpal tunnel release; Flexible sigmoidoscopy (N/A, 11/07/2012); Hemorrhoid banding (N/A, 11/07/2012); and Colonoscopy (2011).   His family history includes Colon cancer in his paternal aunt and paternal grandmother; Heart disease in his maternal grandfather and paternal grandfather.He reports that he quit smoking about 34 years ago. His smoking use included cigarettes. He has never used smokeless tobacco. He reports current alcohol use. He reports that he does not use drugs.  Current Outpatient Medications on File Prior to Visit  Medication Sig Dispense Refill   aspirin EC 81 MG tablet Take 81 mg by mouth daily.     fluocinonide cream (LIDEX) 2.58 % Apply 1 application topically 2 (two) times daily. 120 g 3    hydrocortisone (ANUSOL-HC) 25 MG suppository PLACE 1 SUPPOSITORY (25 MG TOTAL) RECTALLY 4 (FOUR) TIMES DAILY. 56 suppository 5   meloxicam (MOBIC) 15 MG tablet TAKE 1 TABLET (15 MG TOTAL) BY MOUTH DAILY. 90 tablet 1   No current facility-administered medications on file prior to visit.    ROS Review of Systems  Constitutional:  Negative for fever.  Respiratory:  Negative for shortness of breath.   Cardiovascular:  Negative for chest pain.  Musculoskeletal:  Negative for arthralgias.  Skin:  Negative for rash.   Objective:  Pulse (!) 53   Temp (!) 96.6 F (35.9 C)   Ht 5\' 6"  (1.676 m)   Wt 195 lb 12.8 oz (88.8 kg)   SpO2 95%   BMI 31.60 kg/m   BP Readings from Last 3 Encounters:  11/13/20 104/64  12/11/19 96/63  10/24/19 129/78    Wt Readings from Last 3 Encounters:  06/02/21 195 lb 12.8 oz (88.8 kg)  11/13/20 203 lb (92.1 kg)  12/11/19 206 lb 12.8 oz (93.8 kg)     Physical Exam Constitutional:      General: He is not in acute distress.    Appearance: He is well-developed.  HENT:     Head: Normocephalic and atraumatic.     Right Ear: External ear normal.     Left Ear: External ear normal.     Nose: Nose normal.  Eyes:     Conjunctiva/sclera: Conjunctivae normal.     Pupils: Pupils are equal, round, and reactive to light.  Cardiovascular:     Rate and Rhythm: Normal rate and regular rhythm.     Heart sounds: Normal heart sounds. No murmur heard.  Pulmonary:     Effort: Pulmonary effort is normal. No respiratory distress.     Breath sounds: Normal breath sounds. No wheezing or rales.  Abdominal:     Palpations: Abdomen is soft.     Tenderness: There is no abdominal tenderness.  Musculoskeletal:        General: Normal range of motion.     Cervical back: Normal range of motion and neck supple.  Skin:    General: Skin is warm and dry.  Neurological:     Mental Status: He is alert and oriented to person, place, and time.     Deep Tendon Reflexes: Reflexes are  normal and symmetric.  Psychiatric:        Behavior: Behavior normal.        Thought Content: Thought content normal.        Judgment: Judgment normal.    No results found for: HGBA1C  Lab Results  Component Value Date   WBC 5.0 05/30/2021   HGB 15.2 05/30/2021   HCT 46.0 05/30/2021   PLT 207 05/30/2021   GLUCOSE 99 05/30/2021   CHOL 192 05/30/2021   TRIG 70 05/30/2021   HDL 69 05/30/2021   LDLCALC 110 (H) 05/30/2021   ALT 10 05/30/2021   AST 22 05/30/2021   NA 138 05/30/2021   K 4.4 05/30/2021   CL 101 05/30/2021   CREATININE 1.03 05/30/2021   BUN 19 05/30/2021   CO2 26 05/30/2021   TSH 2.190 06/15/2018   PSA 0.9 10/26/2014    No results found.  Assessment & Plan:   Shailen was seen today for medical management of chronic issues.  Diagnoses and all orders for this visit:  Gastroesophageal reflux disease with esophagitis without hemorrhage  Essential hypertension -     lisinopril (ZESTRIL) 20 MG tablet; Take 1 tablet (20 mg total) by mouth daily.  Mixed hyperlipidemia -     rosuvastatin (CRESTOR) 10 MG tablet; TAKE 1 TABLET BY MOUTH DAILY. FOR CHOLESTEROL  Vitamin D deficiency -     Vitamin D, Ergocalciferol, (DRISDOL) 1.25 MG (50000 UNIT) CAPS capsule; TAKE 1 CAPSULE BY MOUTH ONE TIME PER WEEK  I have discontinued Johnatan Prescott's pantoprazole. I have also changed his lisinopril. Additionally, I am having him maintain his fluocinonide cream, hydrocortisone, aspirin EC, meloxicam, rosuvastatin, and Vitamin D (Ergocalciferol).  Meds ordered this encounter  Medications   lisinopril (ZESTRIL) 20 MG tablet    Sig: Take 1 tablet (20 mg total) by mouth daily.    Dispense:  90 tablet    Refill:  3   rosuvastatin (CRESTOR) 10 MG tablet    Sig: TAKE 1 TABLET BY MOUTH DAILY. FOR CHOLESTEROL    Dispense:  90 tablet    Refill:  2   Vitamin D, Ergocalciferol, (DRISDOL) 1.25 MG (50000 UNIT) CAPS capsule    Sig: TAKE 1 CAPSULE BY MOUTH ONE TIME PER WEEK    Dispense:  13  capsule    Refill:  3     Follow-up: Return in about 6 months (around 11/30/2021) for [employer] physical.  Claretta Fraise, M.D.

## 2021-08-01 DIAGNOSIS — K1379 Other lesions of oral mucosa: Secondary | ICD-10-CM | POA: Diagnosis not present

## 2021-08-01 DIAGNOSIS — R490 Dysphonia: Secondary | ICD-10-CM | POA: Diagnosis not present

## 2021-11-07 ENCOUNTER — Other Ambulatory Visit: Payer: Self-pay | Admitting: Family Medicine

## 2021-12-02 ENCOUNTER — Ambulatory Visit: Payer: Medicare Other | Admitting: Family Medicine

## 2022-02-05 ENCOUNTER — Other Ambulatory Visit: Payer: Self-pay | Admitting: Family Medicine

## 2022-03-06 ENCOUNTER — Telehealth: Payer: Self-pay | Admitting: Family Medicine

## 2022-03-09 ENCOUNTER — Other Ambulatory Visit: Payer: Self-pay | Admitting: *Deleted

## 2022-03-09 ENCOUNTER — Other Ambulatory Visit: Payer: Medicare Other

## 2022-03-09 ENCOUNTER — Other Ambulatory Visit: Payer: Self-pay | Admitting: Family Medicine

## 2022-03-09 DIAGNOSIS — E782 Mixed hyperlipidemia: Secondary | ICD-10-CM

## 2022-03-09 DIAGNOSIS — N400 Enlarged prostate without lower urinary tract symptoms: Secondary | ICD-10-CM

## 2022-03-09 DIAGNOSIS — E559 Vitamin D deficiency, unspecified: Secondary | ICD-10-CM

## 2022-03-09 DIAGNOSIS — I10 Essential (primary) hypertension: Secondary | ICD-10-CM | POA: Diagnosis not present

## 2022-03-09 DIAGNOSIS — Z125 Encounter for screening for malignant neoplasm of prostate: Secondary | ICD-10-CM

## 2022-03-09 DIAGNOSIS — E669 Obesity, unspecified: Secondary | ICD-10-CM

## 2022-03-10 LAB — CMP14+EGFR
ALT: 7 IU/L (ref 0–44)
AST: 17 IU/L (ref 0–40)
Albumin/Globulin Ratio: 1.8 (ref 1.2–2.2)
Albumin: 4 g/dL (ref 3.9–4.9)
Alkaline Phosphatase: 64 IU/L (ref 44–121)
BUN/Creatinine Ratio: 16 (ref 10–24)
BUN: 16 mg/dL (ref 8–27)
Bilirubin Total: 1.2 mg/dL (ref 0.0–1.2)
CO2: 23 mmol/L (ref 20–29)
Calcium: 9.4 mg/dL (ref 8.6–10.2)
Chloride: 104 mmol/L (ref 96–106)
Creatinine, Ser: 1 mg/dL (ref 0.76–1.27)
Globulin, Total: 2.2 g/dL (ref 1.5–4.5)
Glucose: 94 mg/dL (ref 70–99)
Potassium: 4.5 mmol/L (ref 3.5–5.2)
Sodium: 139 mmol/L (ref 134–144)
Total Protein: 6.2 g/dL (ref 6.0–8.5)
eGFR: 82 mL/min/{1.73_m2} (ref >59)

## 2022-03-10 LAB — CBC WITH DIFFERENTIAL/PLATELET
Basophils Absolute: 0 10*3/uL (ref 0.0–0.2)
Basos: 1 %
EOS (ABSOLUTE): 0.1 10*3/uL (ref 0.0–0.4)
Eos: 2 %
Hematocrit: 43.8 % (ref 37.5–51.0)
Hemoglobin: 15.1 g/dL (ref 13.0–17.7)
Immature Grans (Abs): 0 10*3/uL (ref 0.0–0.1)
Immature Granulocytes: 0 %
Lymphocytes Absolute: 1.1 10*3/uL (ref 0.7–3.1)
Lymphs: 22 %
MCH: 32.2 pg (ref 26.6–33.0)
MCHC: 34.5 g/dL (ref 31.5–35.7)
MCV: 93 fL (ref 79–97)
Monocytes Absolute: 0.4 10*3/uL (ref 0.1–0.9)
Monocytes: 8 %
Neutrophils Absolute: 3.3 10*3/uL (ref 1.4–7.0)
Neutrophils: 67 %
Platelets: 193 10*3/uL (ref 150–450)
RBC: 4.69 x10E6/uL (ref 4.14–5.80)
RDW: 12 % (ref 11.6–15.4)
WBC: 5 10*3/uL (ref 3.4–10.8)

## 2022-03-10 LAB — LIPID PANEL
Chol/HDL Ratio: 2.8 ratio (ref 0.0–5.0)
Cholesterol, Total: 177 mg/dL (ref 100–199)
HDL: 64 mg/dL (ref >39)
LDL Chol Calc (NIH): 99 mg/dL (ref 0–99)
Triglycerides: 72 mg/dL (ref 0–149)
VLDL Cholesterol Cal: 14 mg/dL (ref 5–40)

## 2022-03-11 ENCOUNTER — Encounter: Payer: Self-pay | Admitting: Family Medicine

## 2022-03-11 ENCOUNTER — Ambulatory Visit (INDEPENDENT_AMBULATORY_CARE_PROVIDER_SITE_OTHER): Payer: Medicare Other | Admitting: Family Medicine

## 2022-03-11 VITALS — BP 122/76 | HR 53 | Temp 97.5°F | Ht 66.0 in | Wt 201.2 lb

## 2022-03-11 DIAGNOSIS — E559 Vitamin D deficiency, unspecified: Secondary | ICD-10-CM

## 2022-03-11 DIAGNOSIS — E669 Obesity, unspecified: Secondary | ICD-10-CM

## 2022-03-11 DIAGNOSIS — E782 Mixed hyperlipidemia: Secondary | ICD-10-CM

## 2022-03-11 DIAGNOSIS — I10 Essential (primary) hypertension: Secondary | ICD-10-CM

## 2022-03-11 MED ORDER — MELOXICAM 15 MG PO TABS: 15.0000 mg | ORAL_TABLET | Freq: Every day | ORAL | 0 refills | Status: DC

## 2022-03-11 MED ORDER — LISINOPRIL 20 MG PO TABS: 20.0000 mg | ORAL_TABLET | Freq: Every day | ORAL | 3 refills | Status: DC

## 2022-03-11 MED ORDER — ROSUVASTATIN CALCIUM 10 MG PO TABS
ORAL_TABLET | ORAL | 2 refills | Status: DC
Start: 2022-03-11 — End: 2023-01-06

## 2022-03-11 MED ORDER — VITAMIN D (ERGOCALCIFEROL) 1.25 MG (50000 UNIT) PO CAPS: ORAL_CAPSULE | ORAL | 3 refills | Status: DC

## 2022-03-11 NOTE — Progress Notes (Signed)
Subjective:  Patient ID: Nathaniel Kelly, male    DOB: 25-Aug-1955  Age: 66 y.o. MRN: [id number]  CC: Medical Management of Chronic Issues   HPI Nathaniel Kelly presents for  presents for  follow-up of hypertension. Patient has no history of headache chest pain or shortness of breath or recent cough. Patient also denies symptoms of TIA such as focal numbness or weakness. Patient denies side effects from medication. States taking it regularly.   in for follow-up of elevated cholesterol. Doing well without complaints on current medication. Denies side effects of statin including myalgia and arthralgia and nausea. Currently no chest pain, shortness of breath or other cardiovascular related symptoms noted.  Meloxicam keeping joint pain under control. Using it daily. Supplements with prn CBD gummies.  Fingers stay numb.      03/11/2022   10:53 AM 06/02/2021   10:46 AM 11/13/2020    1:29 PM  Depression screen PHQ 2/9  Decreased Interest 0 0 0  Down, Depressed, Hopeless 0 0 0  PHQ - 2 Score 0 0 0    History Nathaniel Kelly has a past medical history of Colon polyps, Diverticulosis, GERD (gastroesophageal reflux disease), Hyperlipemia, Hypertension, and Internal hemorrhoids without mention of complication (10/26/1854).   Nathaniel Kelly has a past surgical history that includes Hernia repair; Knee arthroscopy; Carpal tunnel release; Flexible sigmoidoscopy (N/A, 11/07/2012); Hemorrhoid banding (N/A, 11/07/2012); and Colonoscopy (2011).   His family history includes Colon cancer in his paternal aunt and paternal grandmother; Heart disease in his maternal grandfather and paternal grandfather.Nathaniel Kelly reports that Nathaniel Kelly quit smoking about 35 years ago. His smoking use included cigarettes. Nathaniel Kelly has never used smokeless tobacco. Nathaniel Kelly reports current alcohol use. Nathaniel Kelly reports that Nathaniel Kelly does not use drugs.    ROS Review of Systems  Constitutional:  Negative for fever.  Respiratory:  Negative for shortness of breath.   Cardiovascular:  Negative  for chest pain.  Musculoskeletal:  Negative for arthralgias.  Skin:  Negative for rash.    Objective:  BP 122/76   Pulse (!) 53   Temp (!) 97.5 F (36.4 C)   Ht '5\' 6"'$  (1.676 m)   Wt 201 lb 3.2 oz (91.3 kg)   SpO2 96%   BMI 32.47 kg/m   BP Readings from Last 3 Encounters:  03/11/22 122/76  11/13/20 104/64  12/11/19 96/63    Wt Readings from Last 3 Encounters:  03/11/22 201 lb 3.2 oz (91.3 kg)  06/02/21 195 lb 12.8 oz (88.8 kg)  11/13/20 203 lb (92.1 kg)     Physical Exam Vitals reviewed.  Constitutional:      Appearance: Nathaniel Kelly is well-developed.  HENT:     Head: Normocephalic and atraumatic.     Right Ear: External ear normal.     Left Ear: External ear normal.     Mouth/Throat:     Pharynx: No oropharyngeal exudate or posterior oropharyngeal erythema.  Eyes:     Pupils: Pupils are equal, round, and reactive to light.  Cardiovascular:     Rate and Rhythm: Normal rate and regular rhythm.     Heart sounds: No murmur heard. Pulmonary:     Effort: No respiratory distress.     Breath sounds: Normal breath sounds.  Musculoskeletal:     Cervical back: Normal range of motion and neck supple.  Neurological:     Mental Status: Nathaniel Kelly is alert and oriented to person, place, and time.       Assessment & Plan:   Nathaniel Kelly was seen today for medical  management of chronic issues.  Diagnoses and all orders for this visit:  Essential hypertension -     lisinopril (ZESTRIL) 20 MG tablet; Take 1 tablet (20 mg total) by mouth daily.  Mixed hyperlipidemia -     rosuvastatin (CRESTOR) 10 MG tablet; TAKE 1 TABLET BY MOUTH DAILY. FOR CHOLESTEROL  Obesity (BMI 30-39.9)  Vitamin D deficiency -     Vitamin D, Ergocalciferol, (DRISDOL) 1.25 MG (50000 UNIT) CAPS capsule; TAKE 1 CAPSULE BY MOUTH ONE TIME PER WEEK  Other orders -     meloxicam (MOBIC) 15 MG tablet; Take 1 tablet (15 mg total) by mouth daily. (NEEDS TO BE SEEN BEFORE NEXT REFILL)       I am having Nathaniel Kelly  maintain his fluocinonide cream, hydrocortisone, aspirin EC, rosuvastatin, lisinopril, meloxicam, and Vitamin D (Ergocalciferol).  Allergies as of 03/11/2022       Reactions   Atorvastatin Other (See Comments)   myalgia   Ezetimibe    Statins Other (See Comments)   Joint problems        Medication List        Accurate as of March 11, 2022 11:36 AM. If you have any questions, ask your nurse or doctor.          aspirin EC 81 MG tablet Take 81 mg by mouth daily.   fluocinonide cream 0.05 % Commonly known as: LIDEX Apply 1 application topically 2 (two) times daily.   hydrocortisone 25 MG suppository Commonly known as: ANUSOL-HC PLACE 1 SUPPOSITORY (25 MG TOTAL) RECTALLY 4 (FOUR) TIMES DAILY.   lisinopril 20 MG tablet Commonly known as: ZESTRIL Take 1 tablet (20 mg total) by mouth daily.   meloxicam 15 MG tablet Commonly known as: MOBIC Take 1 tablet (15 mg total) by mouth daily. (NEEDS TO BE SEEN BEFORE NEXT REFILL)   rosuvastatin 10 MG tablet Commonly known as: CRESTOR TAKE 1 TABLET BY MOUTH DAILY. FOR CHOLESTEROL   Vitamin D (Ergocalciferol) 1.25 MG (50000 UNIT) Caps capsule Commonly known as: DRISDOL TAKE 1 CAPSULE BY MOUTH ONE TIME PER WEEK         Follow-up: Return in about 6 months (around 09/11/2022).  Claretta Fraise, M.D.

## 2022-05-28 DIAGNOSIS — Z23 Encounter for immunization: Secondary | ICD-10-CM | POA: Diagnosis not present

## 2022-06-29 ENCOUNTER — Other Ambulatory Visit: Payer: Self-pay | Admitting: Family Medicine

## 2022-09-03 ENCOUNTER — Ambulatory Visit (INDEPENDENT_AMBULATORY_CARE_PROVIDER_SITE_OTHER): Payer: Medicare Other | Admitting: Podiatry

## 2022-09-03 VITALS — BP 138/72 | HR 54

## 2022-09-03 DIAGNOSIS — Q828 Other specified congenital malformations of skin: Secondary | ICD-10-CM

## 2022-09-03 NOTE — Progress Notes (Signed)
Subjective:  Patient ID: Nathaniel Kelly, male    DOB: 1955/09/06,  MRN: [id number]  Chief Complaint  Patient presents with   Callouses    Patient reports bilateral callus causing pain and discomfort to the bottom of his feet.     67 y.o. male presents with the above complaint.  Patient presents with bilateral submetatarsal 5 porokeratosis with underlying plantarflexed fifth metatarsal.  Patient states painful to touch painful to walk on he states been on for quite some time is tried to get himself is getting some relief but he goes back and structuring it.  He has not seen MRIs prior to seeing me pain scale 7 out of 10.  He would like me to do something about it.   Review of Systems: Negative except as noted in the HPI. Denies N/V/F/Ch.  Past Medical History:  Diagnosis Date   Colon polyps    Diverticulosis    GERD (gastroesophageal reflux disease)    Hyperlipemia    Hypertension    Internal hemorrhoids without mention of complication 0000000    Current Outpatient Medications:    aspirin EC 81 MG tablet, Take 81 mg by mouth daily., Disp: , Rfl:    fluocinonide cream (LIDEX) AB-123456789 %, Apply 1 application topically 2 (two) times daily., Disp: 120 g, Rfl: 3   hydrocortisone (ANUSOL-HC) 25 MG suppository, PLACE 1 SUPPOSITORY (25 MG TOTAL) RECTALLY 4 (FOUR) TIMES DAILY., Disp: 56 suppository, Rfl: 5   lisinopril (ZESTRIL) 20 MG tablet, Take 1 tablet (20 mg total) by mouth daily., Disp: 90 tablet, Rfl: 3   meloxicam (MOBIC) 15 MG tablet, Take 1 tablet (15 mg total) by mouth daily., Disp: 90 tablet, Rfl: 3   rosuvastatin (CRESTOR) 10 MG tablet, TAKE 1 TABLET BY MOUTH DAILY. FOR CHOLESTEROL, Disp: 90 tablet, Rfl: 2   Vitamin D, Ergocalciferol, (DRISDOL) 1.25 MG (50000 UNIT) CAPS capsule, TAKE 1 CAPSULE BY MOUTH ONE TIME PER WEEK, Disp: 13 capsule, Rfl: 3  Social History   Tobacco Use  Smoking Status Former   Types: Cigarettes   Quit date: 07/20/1986   Years since quitting: 36.1  Smokeless  Tobacco Never    Allergies  Allergen Reactions   Atorvastatin Other (See Comments)    myalgia   Ezetimibe    Statins Other (See Comments)    Joint problems   Objective:  There were no vitals filed for this visit. There is no height or weight on file to calculate BMI. Constitutional Well developed. Well nourished.  Vascular Dorsalis pedis pulses palpable bilaterally. Posterior tibial pulses palpable bilaterally. Capillary refill normal to all digits.  No cyanosis or clubbing noted. Pedal hair growth normal.  Neurologic Normal speech. Oriented to person, place, and time. Epicritic sensation to light touch grossly present bilaterally.  Dermatologic Hyperkeratotic lesion with central nucleated core noted to submetatarsal 5 with underlying plantarflexed fifth metatarsal noted.  Pain on palpation to the lesion.  Orthopedic: Normal joint ROM without pain or crepitus bilaterally. No visible deformities. No bony tenderness.   Radiographs: None Assessment:  No diagnosis found. Plan:  Patient was evaluated and treated and all questions answered.  Bilateral submetatarsal 5 porokeratosis with underlying plantarflexed fifth metatarsal. -All questions and surgery were discussed with the patient in extensive detail -Using chisel blade handle the lesion was debrided down to healthy dry tissue no complication noted no pinpoint bleeding noted this was done as part of a courtesy -.  I discussed shoe gear modification padding protecting offloading -If there is no resolving we  will discuss surgical options at that time.  Patient agrees with the plan.  No follow-ups on file.

## 2022-09-10 ENCOUNTER — Other Ambulatory Visit: Payer: Medicare Other

## 2022-09-10 ENCOUNTER — Telehealth: Payer: Self-pay | Admitting: Family Medicine

## 2022-09-10 ENCOUNTER — Other Ambulatory Visit: Payer: Self-pay | Admitting: *Deleted

## 2022-09-10 DIAGNOSIS — E782 Mixed hyperlipidemia: Secondary | ICD-10-CM | POA: Diagnosis not present

## 2022-09-10 DIAGNOSIS — Z125 Encounter for screening for malignant neoplasm of prostate: Secondary | ICD-10-CM | POA: Diagnosis not present

## 2022-09-10 DIAGNOSIS — I10 Essential (primary) hypertension: Secondary | ICD-10-CM

## 2022-09-10 NOTE — Telephone Encounter (Signed)
Labs ordered, patient informed

## 2022-09-10 NOTE — Telephone Encounter (Signed)
Patient would like to come in to have labs drawn before his appt on 2/26. Please add and call back

## 2022-09-11 LAB — PSA, TOTAL AND FREE
PSA, Free Pct: 16 %
PSA, Free: 0.24 ng/mL
Prostate Specific Ag, Serum: 1.5 ng/mL (ref 0.0–4.0)

## 2022-09-11 LAB — CBC WITH DIFFERENTIAL/PLATELET
Basophils Absolute: 0.1 10*3/uL (ref 0.0–0.2)
Basos: 1 %
EOS (ABSOLUTE): 0.1 10*3/uL (ref 0.0–0.4)
Eos: 2 %
Hematocrit: 43 % (ref 37.5–51.0)
Hemoglobin: 14.5 g/dL (ref 13.0–17.7)
Immature Grans (Abs): 0 10*3/uL (ref 0.0–0.1)
Immature Granulocytes: 0 %
Lymphocytes Absolute: 1.4 10*3/uL (ref 0.7–3.1)
Lymphs: 25 %
MCH: 30.5 pg (ref 26.6–33.0)
MCHC: 33.7 g/dL (ref 31.5–35.7)
MCV: 91 fL (ref 79–97)
Monocytes Absolute: 0.5 10*3/uL (ref 0.1–0.9)
Monocytes: 9 %
Neutrophils Absolute: 3.3 10*3/uL (ref 1.4–7.0)
Neutrophils: 63 %
Platelets: 248 10*3/uL (ref 150–450)
RBC: 4.75 x10E6/uL (ref 4.14–5.80)
RDW: 12.2 % (ref 11.6–15.4)
WBC: 5.3 10*3/uL (ref 3.4–10.8)

## 2022-09-11 LAB — LIPID PANEL
Chol/HDL Ratio: 3.1 ratio (ref 0.0–5.0)
Cholesterol, Total: 191 mg/dL (ref 100–199)
HDL: 62 mg/dL (ref >39)
LDL Chol Calc (NIH): 116 mg/dL — ABNORMAL HIGH (ref 0–99)
Triglycerides: 69 mg/dL (ref 0–149)
VLDL Cholesterol Cal: 13 mg/dL (ref 5–40)

## 2022-09-11 LAB — CMP14+EGFR
ALT: 11 IU/L (ref 0–44)
AST: 23 IU/L (ref 0–40)
Albumin/Globulin Ratio: 1.8 (ref 1.2–2.2)
Albumin: 4.2 g/dL (ref 3.9–4.9)
Alkaline Phosphatase: 80 IU/L (ref 44–121)
BUN/Creatinine Ratio: 21 (ref 10–24)
BUN: 18 mg/dL (ref 8–27)
Bilirubin Total: 1.5 mg/dL — ABNORMAL HIGH (ref 0.0–1.2)
CO2: 21 mmol/L (ref 20–29)
Calcium: 9.5 mg/dL (ref 8.6–10.2)
Chloride: 100 mmol/L (ref 96–106)
Creatinine, Ser: 0.87 mg/dL (ref 0.76–1.27)
Globulin, Total: 2.3 g/dL (ref 1.5–4.5)
Glucose: 83 mg/dL (ref 70–99)
Potassium: 4.1 mmol/L (ref 3.5–5.2)
Sodium: 138 mmol/L (ref 134–144)
Total Protein: 6.5 g/dL (ref 6.0–8.5)
eGFR: 93 mL/min/{1.73_m2} (ref >59)

## 2022-09-14 ENCOUNTER — Ambulatory Visit (INDEPENDENT_AMBULATORY_CARE_PROVIDER_SITE_OTHER): Payer: Medicare Other | Admitting: Family Medicine

## 2022-09-14 ENCOUNTER — Encounter: Payer: Self-pay | Admitting: Family Medicine

## 2022-09-14 VITALS — BP 116/67 | HR 60 | Temp 97.1°F | Ht 66.0 in | Wt 198.4 lb

## 2022-09-14 DIAGNOSIS — K2100 Gastro-esophageal reflux disease with esophagitis, without bleeding: Secondary | ICD-10-CM | POA: Diagnosis not present

## 2022-09-14 DIAGNOSIS — E782 Mixed hyperlipidemia: Secondary | ICD-10-CM

## 2022-09-14 DIAGNOSIS — I10 Essential (primary) hypertension: Secondary | ICD-10-CM

## 2022-09-14 DIAGNOSIS — Z23 Encounter for immunization: Secondary | ICD-10-CM

## 2022-09-14 MED ORDER — MESALAMINE 400 MG PO CPDR: 800.0000 mg | DELAYED_RELEASE_CAPSULE | Freq: Three times a day (TID) | ORAL | 1 refills | Status: DC

## 2022-09-14 MED ORDER — HYDROCORTISONE ACE-PRAMOXINE 1-1 % EX CREA: 1.0000 | TOPICAL_CREAM | Freq: Two times a day (BID) | CUTANEOUS | 5 refills | Status: DC

## 2022-09-14 NOTE — Progress Notes (Signed)
Subjective:  Patient ID: Nathaniel Kelly, male    DOB: Dec 16, 1955  Age: 67 y.o. MRN: [id number]  CC: Medical Management of Chronic Issues   HPI Nathaniel Kelly presents for  follow-up of hypertension. Patient has no history of headache chest pain or shortness of breath or recent cough. Patient also denies symptoms of TIA such as focal numbness or weakness. Patient denies side effects from medication. States taking it regularly.   in for follow-up of elevated cholesterol. Doing well without complaints on current medication. Denies side effects of statin including myalgia and arthralgia and nausea. Currently no chest pain, shortness of breath or other cardiovascular related symptoms noted.  Patient is experiencing frequent mucousy discharge from the rectum.  He has to avoid passing gas or he will soil himself.  This has been going on for about 6 weeks.  There is some discomfort but no blood he has hemorrhoids.  He has some cramping with his bowel movement he points to the left lower quadrant.   Meloxicam for pain working well. Still has occasional joint pain. Renal function done for today is nml, with eGFR = 93  History Nathaniel Kelly has a past medical history of Colon polyps, Diverticulosis, GERD (gastroesophageal reflux disease), Hyperlipemia, Hypertension, and Internal hemorrhoids without mention of complication (0000000).   He has a past surgical history that includes Hernia repair; Knee arthroscopy; Carpal tunnel release; Flexible sigmoidoscopy (N/A, 11/07/2012); Hemorrhoid banding (N/A, 11/07/2012); and Colonoscopy (2011).   His family history includes Colon cancer in his paternal aunt and paternal grandmother; Heart disease in his maternal grandfather and paternal grandfather.He reports that he quit smoking about 36 years ago. His smoking use included cigarettes. He has never used smokeless tobacco. He reports current alcohol use. He reports that he does not use drugs.  Current Outpatient Medications  on File Prior to Visit  Medication Sig Dispense Refill   aspirin EC 81 MG tablet Take 81 mg by mouth daily.     fluocinonide cream (LIDEX) [id number] % Apply 1 application topically 2 (two) times daily. 120 g 3   lisinopril (ZESTRIL) 20 MG tablet Take 1 tablet (20 mg total) by mouth daily. 90 tablet 3   meloxicam (MOBIC) 15 MG tablet Take 1 tablet (15 mg total) by mouth daily. 90 tablet 3   rosuvastatin (CRESTOR) 10 MG tablet TAKE 1 TABLET BY MOUTH DAILY. FOR CHOLESTEROL 90 tablet 2   Vitamin D, Ergocalciferol, (DRISDOL) 1.25 MG (50000 UNIT) CAPS capsule TAKE 1 CAPSULE BY MOUTH ONE TIME PER WEEK 13 capsule 3   No current facility-administered medications on file prior to visit.    ROS Review of Systems  Constitutional:  Negative for fever.  Respiratory:  Negative for shortness of breath.   Cardiovascular:  Negative for chest pain.  Gastrointestinal:  Negative for anal bleeding, blood in stool and diarrhea.  Musculoskeletal:  Negative for arthralgias.  Skin:  Negative for rash.    Objective:  BP 116/67   Pulse 60   Temp (!) 97.1 F (36.2 C)   Ht '5\' 6"'$  (1.676 m)   Wt 198 lb 6.4 oz (90 kg)   SpO2 95%   BMI 32.02 kg/m   BP Readings from Last 3 Encounters:  09/14/22 116/67  09/03/22 138/72  03/11/22 122/76    Wt Readings from Last 3 Encounters:  09/14/22 198 lb 6.4 oz (90 kg)  03/11/22 201 lb 3.2 oz (91.3 kg)  06/02/21 195 lb 12.8 oz (88.8 kg)     Physical Exam Vitals reviewed.  Constitutional:      Appearance: He is well-developed.  HENT:     Head: Normocephalic and atraumatic.     Right Ear: External ear normal.     Left Ear: External ear normal.     Mouth/Throat:     Pharynx: No oropharyngeal exudate or posterior oropharyngeal erythema.  Eyes:     Pupils: Pupils are equal, round, and reactive to light.  Cardiovascular:     Rate and Rhythm: Normal rate and regular rhythm.     Heart sounds: No murmur heard. Pulmonary:     Effort: No respiratory distress.      Breath sounds: Normal breath sounds.  Musculoskeletal:     Cervical back: Normal range of motion and neck supple.  Neurological:     Mental Status: He is alert and oriented to person, place, and time.       Assessment & Plan:   [name] was seen today for medical management of chronic issues.  Diagnoses and all orders for this visit:  Essential hypertension  Mixed hyperlipidemia  Gastroesophageal reflux disease with esophagitis without hemorrhage  Other orders -     Mesalamine (ASACOL) 400 MG CPDR DR capsule; Take 2 capsules (800 mg total) by mouth 3 (three) times daily. -     pramoxine-hydrocortisone (PROCTOCREAM-HC) 1-1 % rectal cream; Place 1 Application rectally 2 (two) times daily.   Allergies as of 09/14/2022       Reactions   Atorvastatin Other (See Comments)   myalgia   Ezetimibe    Statins Other (See Comments)   Joint problems        Medication List        Accurate as of September 14, 2022 12:13 PM. If you have any questions, ask your nurse or doctor.          STOP taking these medications    hydrocortisone 25 MG suppository Commonly known as: ANUSOL-HC Stopped by: Claretta Fraise, MD       TAKE these medications    aspirin EC 81 MG tablet Take 81 mg by mouth daily.   fluocinonide cream 0.05 % Commonly known as: LIDEX Apply 1 application topically 2 (two) times daily.   lisinopril 20 MG tablet Commonly known as: ZESTRIL Take 1 tablet (20 mg total) by mouth daily.   meloxicam 15 MG tablet Commonly known as: MOBIC Take 1 tablet (15 mg total) by mouth daily.   Mesalamine 400 MG Cpdr DR capsule Commonly known as: ASACOL Take 2 capsules (800 mg total) by mouth 3 (three) times daily. Started by: Claretta Fraise, MD   pramoxine-hydrocortisone 1-1 % rectal cream Commonly known as: PROCTOCREAM-HC Place 1 Application rectally 2 (two) times daily. Started by: Claretta Fraise, MD   rosuvastatin 10 MG tablet Commonly known as: CRESTOR TAKE 1  TABLET BY MOUTH DAILY. FOR CHOLESTEROL   Vitamin D (Ergocalciferol) 1.25 MG (50000 UNIT) Caps capsule Commonly known as: DRISDOL TAKE 1 CAPSULE BY MOUTH ONE TIME PER WEEK        Meds ordered this encounter  Medications   Mesalamine (ASACOL) 400 MG CPDR DR capsule    Sig: Take 2 capsules (800 mg total) by mouth 3 (three) times daily.    Dispense:  180 capsule    Refill:  1   pramoxine-hydrocortisone (PROCTOCREAM-HC) 1-1 % rectal cream    Sig: Place 1 Application rectally 2 (two) times daily.    Dispense:  30 g    Refill:  5      Follow-up: Return in  about 6 weeks (around 10/26/2022).  Claretta Fraise, M.D.

## 2022-10-13 DIAGNOSIS — Z23 Encounter for immunization: Secondary | ICD-10-CM

## 2022-10-19 NOTE — Addendum Note (Signed)
Addended by: Baldomero Lamy B on: 10/19/2022 02:48 PM   Modules accepted: Orders

## 2022-10-26 ENCOUNTER — Ambulatory Visit: Payer: Medicare Other | Admitting: Family Medicine

## 2023-01-06 ENCOUNTER — Other Ambulatory Visit: Payer: Self-pay | Admitting: *Deleted

## 2023-01-06 ENCOUNTER — Telehealth: Payer: Self-pay | Admitting: Family Medicine

## 2023-01-06 ENCOUNTER — Other Ambulatory Visit: Payer: Self-pay | Admitting: Family Medicine

## 2023-01-06 DIAGNOSIS — E782 Mixed hyperlipidemia: Secondary | ICD-10-CM

## 2023-01-06 DIAGNOSIS — I10 Essential (primary) hypertension: Secondary | ICD-10-CM

## 2023-01-06 NOTE — Telephone Encounter (Signed)
done

## 2023-01-06 NOTE — Telephone Encounter (Signed)
Stacks NTBS in aug for 6 mos FU RF sent to pharmacy

## 2023-01-06 NOTE — Telephone Encounter (Signed)
Appt made for 02/08/2023

## 2023-02-04 ENCOUNTER — Other Ambulatory Visit: Payer: Medicare Other

## 2023-02-04 DIAGNOSIS — I10 Essential (primary) hypertension: Secondary | ICD-10-CM

## 2023-02-04 DIAGNOSIS — E782 Mixed hyperlipidemia: Secondary | ICD-10-CM | POA: Diagnosis not present

## 2023-02-04 LAB — CMP14+EGFR
ALT: 7 IU/L (ref 0–44)
AST: 21 IU/L (ref 0–40)
Albumin: 3.9 g/dL (ref 3.9–4.9)
Alkaline Phosphatase: 66 IU/L (ref 44–121)
BUN/Creatinine Ratio: 23 (ref 10–24)
BUN: 22 mg/dL (ref 8–27)
Bilirubin Total: 1.1 mg/dL (ref 0.0–1.2)
CO2: 24 mmol/L (ref 20–29)
Calcium: 9.1 mg/dL (ref 8.6–10.2)
Chloride: 102 mmol/L (ref 96–106)
Creatinine, Ser: 0.95 mg/dL (ref 0.76–1.27)
Globulin, Total: 2.1 g/dL (ref 1.5–4.5)
Glucose: 89 mg/dL (ref 70–99)
Potassium: 4.4 mmol/L (ref 3.5–5.2)
Sodium: 137 mmol/L (ref 134–144)
Total Protein: 6 g/dL (ref 6.0–8.5)
eGFR: 87 mL/min/{1.73_m2} (ref >59)

## 2023-02-04 LAB — CBC WITH DIFFERENTIAL/PLATELET
Basophils Absolute: 0 10*3/uL (ref 0.0–0.2)
Basos: 1 %
EOS (ABSOLUTE): 0.2 10*3/uL (ref 0.0–0.4)
Eos: 3 %
Hematocrit: 42.4 % (ref 37.5–51.0)
Hemoglobin: 14.1 g/dL (ref 13.0–17.7)
Immature Grans (Abs): 0 10*3/uL (ref 0.0–0.1)
Immature Granulocytes: 1 %
Lymphocytes Absolute: 1.4 10*3/uL (ref 0.7–3.1)
Lymphs: 29 %
MCH: 31 pg (ref 26.6–33.0)
MCHC: 33.3 g/dL (ref 31.5–35.7)
MCV: 93 fL (ref 79–97)
Monocytes Absolute: 0.4 10*3/uL (ref 0.1–0.9)
Monocytes: 9 %
Neutrophils Absolute: 2.7 10*3/uL (ref 1.4–7.0)
Neutrophils: 57 %
Platelets: 198 10*3/uL (ref 150–450)
RBC: 4.55 x10E6/uL (ref 4.14–5.80)
RDW: 12.9 % (ref 11.6–15.4)
WBC: 4.7 10*3/uL (ref 3.4–10.8)

## 2023-02-04 LAB — LIPID PANEL
Chol/HDL Ratio: 2.6 ratio (ref 0.0–5.0)
Cholesterol, Total: 178 mg/dL (ref 100–199)
HDL: 68 mg/dL (ref >39)
LDL Chol Calc (NIH): 96 mg/dL (ref 0–99)
Triglycerides: 75 mg/dL (ref 0–149)
VLDL Cholesterol Cal: 14 mg/dL (ref 5–40)

## 2023-02-07 NOTE — Progress Notes (Signed)
Hello Affan,  Your lab result is normal and/or stable.Some minor variations that are not significant are commonly marked abnormal, but do not represent any medical problem for you.  Best regards, Warren Stacks, M.D.

## 2023-02-08 ENCOUNTER — Ambulatory Visit (INDEPENDENT_AMBULATORY_CARE_PROVIDER_SITE_OTHER): Payer: Medicare Other | Admitting: Family Medicine

## 2023-02-08 ENCOUNTER — Encounter: Payer: Self-pay | Admitting: Family Medicine

## 2023-02-08 DIAGNOSIS — E782 Mixed hyperlipidemia: Secondary | ICD-10-CM | POA: Diagnosis not present

## 2023-02-08 DIAGNOSIS — I10 Essential (primary) hypertension: Secondary | ICD-10-CM | POA: Diagnosis not present

## 2023-02-08 MED ORDER — MESALAMINE 400 MG PO CPDR: 800.0000 mg | DELAYED_RELEASE_CAPSULE | Freq: Three times a day (TID) | ORAL | 0 refills | Status: DC

## 2023-02-08 MED ORDER — LISINOPRIL 20 MG PO TABS: 20.0000 mg | ORAL_TABLET | Freq: Every day | ORAL | 3 refills | Status: DC

## 2023-02-08 MED ORDER — ROSUVASTATIN CALCIUM 10 MG PO TABS
ORAL_TABLET | ORAL | 3 refills | Status: DC
Start: 2023-02-08 — End: 2024-02-09

## 2023-02-08 NOTE — Progress Notes (Signed)
Subjective:  Patient ID: Nathaniel Kelly, male    DOB: 1955-08-18  Age: 67 y.o. MRN: [id number]  CC: Medical Management of Chronic Issues   HPI [name] presents for  presents for  follow-up of hypertension. Patient has no history of headache chest pain or shortness of breath or recent cough. Patient also denies symptoms of TIA such as focal numbness or weakness. Patient denies side effects from medication. States taking it regularly.   Tingling in butt and legs for a couple of months   in for follow-up of elevated cholesterol. Doing well without complaints on current medication. Denies side effects of statin including myalgia and arthralgia and nausea. Currently no chest pain, shortness of breath or other cardiovascular related symptoms noted.      02/08/2023   11:07 AM 09/14/2022    9:03 AM 09/14/2022    8:51 AM  Depression screen PHQ 2/9  Decreased Interest 0 0 0  Down, Depressed, Hopeless 0 0 0  PHQ - 2 Score 0 0 0  Altered sleeping  1   Tired, decreased energy  0   Change in appetite  0   Feeling bad or failure about yourself   0   Trouble concentrating  0   Moving slowly or fidgety/restless  0   Suicidal thoughts  0   PHQ-9 Score  1   Difficult doing work/chores  Not difficult at all     History Cantrell has a past medical history of Colon polyps, Diverticulosis, GERD (gastroesophageal reflux disease), Hyperlipemia, Hypertension, and Internal hemorrhoids without mention of complication (10/19/2012).   He has a past surgical history that includes Hernia repair; Knee arthroscopy; Carpal tunnel release; Flexible sigmoidoscopy (N/A, 11/07/2012); Hemorrhoid banding (N/A, 11/07/2012); and Colonoscopy (2011).   His family history includes Colon cancer in his paternal aunt and paternal grandmother; Heart disease in his maternal grandfather and paternal grandfather.He reports that he quit smoking about 36 years ago. His smoking use included cigarettes. He has never used smokeless  tobacco. He reports current alcohol use. He reports that he does not use drugs.    ROS Review of Systems  Constitutional:  Negative for fever.  Respiratory:  Negative for shortness of breath.   Cardiovascular:  Negative for chest pain.  Musculoskeletal:  Negative for arthralgias.  Skin:  Negative for rash.    Objective:  BP 119/68   Pulse (!) 52   Temp (!) 97.1 F (36.2 C)   Ht 5\' 6"  (1.676 m)   Wt 203 lb 12.8 oz (92.4 kg)   SpO2 96%   BMI 32.89 kg/m   BP Readings from Last 3 Encounters:  02/08/23 119/68  09/14/22 116/67  09/03/22 138/72    Wt Readings from Last 3 Encounters:  02/08/23 203 lb 12.8 oz (92.4 kg)  09/14/22 198 lb 6.4 oz (90 kg)  03/11/22 201 lb 3.2 oz (91.3 kg)     Physical Exam Vitals reviewed.  Constitutional:      Appearance: He is well-developed.  HENT:     Head: Normocephalic and atraumatic.     Right Ear: External ear normal.     Left Ear: External ear normal.     Mouth/Throat:     Pharynx: No oropharyngeal exudate or posterior oropharyngeal erythema.  Eyes:     Pupils: Pupils are equal, round, and reactive to light.  Cardiovascular:     Rate and Rhythm: Normal rate and regular rhythm.     Heart sounds: No murmur heard. Pulmonary:  Effort: No respiratory distress.     Breath sounds: Normal breath sounds.  Musculoskeletal:     Cervical back: Normal range of motion and neck supple.  Neurological:     Mental Status: He is alert and oriented to person, place, and time.       Assessment & Plan:   Philippe was seen today for medical management of chronic issues.  Diagnoses and all orders for this visit:  Essential hypertension -     lisinopril (ZESTRIL) 20 MG tablet; Take 1 tablet (20 mg total) by mouth daily.  Mixed hyperlipidemia -     rosuvastatin (CRESTOR) 10 MG tablet; TAKE 1 TABLET BY MOUTH EVERY DAY FOR CHOLESTEROL  Other orders -     Mesalamine (ASACOL) 400 MG CPDR DR capsule; Take 2 capsules (800 mg total) by mouth 3  (three) times daily.       I am having Daleen Snook maintain his fluocinonide cream, aspirin EC, Vitamin D (Ergocalciferol), meloxicam, pramoxine-hydrocortisone, lisinopril, Mesalamine, and rosuvastatin.  Allergies as of 02/08/2023       Reactions   Atorvastatin Other (See Comments)   myalgia   Ezetimibe    Statins Other (See Comments)   Joint problems        Medication List        Accurate as of February 08, 2023  5:30 PM. If you have any questions, ask your nurse or doctor.          aspirin EC 81 MG tablet Take 81 mg by mouth daily.   fluocinonide cream 0.05 % Commonly known as: LIDEX Apply 1 application topically 2 (two) times daily.   lisinopril 20 MG tablet Commonly known as: ZESTRIL Take 1 tablet (20 mg total) by mouth daily.   meloxicam 15 MG tablet Commonly known as: MOBIC Take 1 tablet (15 mg total) by mouth daily.   Mesalamine 400 MG Cpdr DR capsule Commonly known as: ASACOL Take 2 capsules (800 mg total) by mouth 3 (three) times daily.   pramoxine-hydrocortisone 1-1 % rectal cream Commonly known as: PROCTOCREAM-HC Place 1 Application rectally 2 (two) times daily.   rosuvastatin 10 MG tablet Commonly known as: CRESTOR TAKE 1 TABLET BY MOUTH EVERY DAY FOR CHOLESTEROL   Vitamin D (Ergocalciferol) 1.25 MG (50000 UNIT) Caps capsule Commonly known as: DRISDOL TAKE 1 CAPSULE BY MOUTH ONE TIME PER WEEK         Follow-up: Return in about 6 months (around 08/11/2023), or if symptoms worsen or fail to improve.  Mechele Claude, M.D.

## 2023-05-14 DIAGNOSIS — Z23 Encounter for immunization: Secondary | ICD-10-CM | POA: Diagnosis not present

## 2023-07-24 ENCOUNTER — Other Ambulatory Visit: Payer: Self-pay | Admitting: Family Medicine

## 2023-07-26 ENCOUNTER — Other Ambulatory Visit: Payer: Self-pay | Admitting: Family Medicine

## 2023-07-26 MED ORDER — MELOXICAM 15 MG PO TABS: 15.0000 mg | ORAL_TABLET | Freq: Every day | ORAL | 0 refills | Status: DC

## 2023-07-26 NOTE — Telephone Encounter (Signed)
 Copied from CRM [id number]. Topic: Clinical - Medication Refill >> Jul 26, 2023  9:34 AM Wyona SQUIBB wrote: Most Recent Primary Care Visit:  Provider: ZOLLIE LOWERS  Department: [facility]  Visit Type: OFFICE VISIT  Date: 02/08/2023  Medication:meloxicam  (MOBIC ) 15 MG tablet   Has the patient contacted their pharmacy? Yes (Agent: If no, request that the patient contact the pharmacy for the refill. If patient does not wish to contact the pharmacy document the reason why and proceed with request.) (Agent: If yes, when and what did the pharmacy advise?)  Is this the correct pharmacy for this prescription? Yes If no, delete pharmacy and type the correct one.  This is the patient's preferred pharmacy:  [id number] - [place], [place] - [address] [place] [address] Phone: [phone] Fax: [phone]    Has the prescription been filled recently? No  Is the patient out of the medication? Yes  Has the patient been seen for an appointment in the last year OR does the patient have an upcoming appointment? Yes  Can we respond through MyChart? Yes  Agent: Please be advised that Rx refills may take up to 3 business days. We ask that you follow-up with your pharmacy.

## 2023-08-04 ENCOUNTER — Telehealth: Payer: Self-pay

## 2023-08-04 ENCOUNTER — Other Ambulatory Visit: Payer: Self-pay | Admitting: *Deleted

## 2023-08-04 DIAGNOSIS — E782 Mixed hyperlipidemia: Secondary | ICD-10-CM

## 2023-08-04 DIAGNOSIS — I10 Essential (primary) hypertension: Secondary | ICD-10-CM

## 2023-08-04 DIAGNOSIS — E559 Vitamin D deficiency, unspecified: Secondary | ICD-10-CM

## 2023-08-04 NOTE — Telephone Encounter (Signed)
 Labs ordered and lab appointment scheduled. MyChart message sent to patient.

## 2023-08-04 NOTE — Telephone Encounter (Signed)
 Copied from CRM [id number]. Topic: Clinical - Request for Lab/Test Order >> Aug 04, 2023 10:23 AM Nathaniel Kelly wrote: Reason for CRM: Pt requesting to get lab work done on Monday 01/20 before going into office for visit on 01/22, please advise if lab order is required.

## 2023-08-09 ENCOUNTER — Other Ambulatory Visit: Payer: Medicare Other

## 2023-08-09 DIAGNOSIS — E782 Mixed hyperlipidemia: Secondary | ICD-10-CM | POA: Diagnosis not present

## 2023-08-09 DIAGNOSIS — Z125 Encounter for screening for malignant neoplasm of prostate: Secondary | ICD-10-CM

## 2023-08-09 DIAGNOSIS — E559 Vitamin D deficiency, unspecified: Secondary | ICD-10-CM

## 2023-08-09 DIAGNOSIS — I10 Essential (primary) hypertension: Secondary | ICD-10-CM

## 2023-08-10 LAB — CMP14+EGFR
ALT: 7 [IU]/L (ref 0–44)
AST: 18 [IU]/L (ref 0–40)
Albumin: 4 g/dL (ref 3.9–4.9)
Alkaline Phosphatase: 75 [IU]/L (ref 44–121)
BUN/Creatinine Ratio: 20 (ref 10–24)
BUN: 18 mg/dL (ref 8–27)
Bilirubin Total: 1 mg/dL (ref 0.0–1.2)
CO2: 25 mmol/L (ref 20–29)
Calcium: 9.1 mg/dL (ref 8.6–10.2)
Chloride: 104 mmol/L (ref 96–106)
Creatinine, Ser: 0.9 mg/dL (ref 0.76–1.27)
Globulin, Total: 2.3 g/dL (ref 1.5–4.5)
Glucose: 86 mg/dL (ref 70–99)
Potassium: 4.4 mmol/L (ref 3.5–5.2)
Sodium: 140 mmol/L (ref 134–144)
Total Protein: 6.3 g/dL (ref 6.0–8.5)
eGFR: 92 mL/min/{1.73_m2} (ref >59)

## 2023-08-10 LAB — CBC WITH DIFFERENTIAL/PLATELET
Basophils Absolute: 0.1 10*3/uL (ref 0.0–0.2)
Basos: 1 %
EOS (ABSOLUTE): 0.1 10*3/uL (ref 0.0–0.4)
Eos: 2 %
Hematocrit: 42.3 % (ref 37.5–51.0)
Hemoglobin: 13.9 g/dL (ref 13.0–17.7)
Immature Grans (Abs): 0 10*3/uL (ref 0.0–0.1)
Immature Granulocytes: 0 %
Lymphocytes Absolute: 1.2 10*3/uL (ref 0.7–3.1)
Lymphs: 26 %
MCH: 30.7 pg (ref 26.6–33.0)
MCHC: 32.9 g/dL (ref 31.5–35.7)
MCV: 93 fL (ref 79–97)
Monocytes Absolute: 0.4 10*3/uL (ref 0.1–0.9)
Monocytes: 9 %
Neutrophils Absolute: 2.9 10*3/uL (ref 1.4–7.0)
Neutrophils: 62 %
Platelets: 227 10*3/uL (ref 150–450)
RBC: 4.53 x10E6/uL (ref 4.14–5.80)
RDW: 11.7 % (ref 11.6–15.4)
WBC: 4.7 10*3/uL (ref 3.4–10.8)

## 2023-08-10 LAB — PSA, TOTAL AND FREE
PSA, Free Pct: 23.3 %
PSA, Free: 0.21 ng/mL
Prostate Specific Ag, Serum: 0.9 ng/mL (ref 0.0–4.0)

## 2023-08-10 LAB — LIPID PANEL
Chol/HDL Ratio: 3 {ratio} (ref 0.0–5.0)
Cholesterol, Total: 176 mg/dL (ref 100–199)
HDL: 58 mg/dL (ref >39)
LDL Chol Calc (NIH): 108 mg/dL — ABNORMAL HIGH (ref 0–99)
Triglycerides: 52 mg/dL (ref 0–149)
VLDL Cholesterol Cal: 10 mg/dL (ref 5–40)

## 2023-08-10 LAB — VITAMIN D 25 HYDROXY (VIT D DEFICIENCY, FRACTURES): Vit D, 25-Hydroxy: 42.1 ng/mL (ref 30.0–100.0)

## 2023-08-11 ENCOUNTER — Encounter: Payer: Self-pay | Admitting: Family Medicine

## 2023-08-11 ENCOUNTER — Ambulatory Visit: Payer: Medicare Other | Admitting: Family Medicine

## 2023-08-11 VITALS — BP 134/70 | HR 47 | Temp 97.4°F | Ht 66.0 in | Wt 201.0 lb

## 2023-08-11 DIAGNOSIS — I10 Essential (primary) hypertension: Secondary | ICD-10-CM | POA: Diagnosis not present

## 2023-08-11 DIAGNOSIS — E782 Mixed hyperlipidemia: Secondary | ICD-10-CM

## 2023-08-11 DIAGNOSIS — K642 Third degree hemorrhoids: Secondary | ICD-10-CM | POA: Diagnosis not present

## 2023-08-11 DIAGNOSIS — E559 Vitamin D deficiency, unspecified: Secondary | ICD-10-CM | POA: Diagnosis not present

## 2023-08-11 DIAGNOSIS — L989 Disorder of the skin and subcutaneous tissue, unspecified: Secondary | ICD-10-CM | POA: Diagnosis not present

## 2023-08-11 MED ORDER — HYDROCORTISONE ACE-PRAMOXINE 1-1 % EX CREA: 1.0000 | TOPICAL_CREAM | Freq: Two times a day (BID) | CUTANEOUS | 5 refills | Status: AC

## 2023-08-11 NOTE — Progress Notes (Signed)
Subjective:  Patient ID: Nathaniel Kelly, male    DOB: 1956/06/10  Age: 68 y.o. MRN: [id number]  CC: Medical Management of Chronic Issues (No concerns at this time. )   HPI Nathaniel Kelly presents for  follow-up of hypertension. Patient has no history of headache chest pain or shortness of breath or recent cough. Patient also denies symptoms of TIA such as focal numbness or weakness. Patient denies side effects from medication. States taking it regularly.  Uses asachol for rectal discharge prn. Needs hemorrhoid doctor. Having some bleeding. Hanging out all the time.    History Nathaniel Kelly has a past medical history of Colon polyps, Diverticulosis, GERD (gastroesophageal reflux disease), Hyperlipemia, Hypertension, and Internal hemorrhoids without mention of complication (10/19/2012).   He has a past surgical history that includes Hernia repair; Knee arthroscopy; Carpal tunnel release; Flexible sigmoidoscopy (N/A, 11/07/2012); Hemorrhoid banding (N/A, 11/07/2012); and Colonoscopy (2011).   His family history includes Colon cancer in his paternal aunt and paternal grandmother; Heart disease in his maternal grandfather and paternal grandfather.He reports that he quit smoking about 37 years ago. His smoking use included cigarettes. He has never used smokeless tobacco. He reports current alcohol use. He reports that he does not use drugs.  Current Outpatient Medications on File Prior to Visit  Medication Sig Dispense Refill   aspirin EC 81 MG tablet Take 81 mg by mouth daily.     lisinopril (ZESTRIL) 20 MG tablet Take 1 tablet (20 mg total) by mouth daily. 90 tablet 3   meloxicam (MOBIC) 15 MG tablet Take 1 tablet (15 mg total) by mouth daily. 90 tablet 0   Mesalamine (ASACOL) 400 MG CPDR DR capsule Take 2 capsules (800 mg total) by mouth 3 (three) times daily. 540 capsule 0   rosuvastatin (CRESTOR) 10 MG tablet TAKE 1 TABLET BY MOUTH EVERY DAY FOR CHOLESTEROL 90 tablet 3   Vitamin D, Ergocalciferol,  (DRISDOL) 1.25 MG (50000 UNIT) CAPS capsule TAKE 1 CAPSULE BY MOUTH ONE TIME PER WEEK 13 capsule 3   No current facility-administered medications on file prior to visit.    ROS Review of Systems  Constitutional:  Negative for fever.  Respiratory:  Negative for shortness of breath.   Cardiovascular:  Negative for chest pain.  Musculoskeletal:  Negative for arthralgias.  Skin:  Negative for rash.    Objective:  BP 134/70   Pulse (!) 47   Temp (!) 97.4 F (36.3 C)   Ht 5\' 6"  (1.676 m)   Wt 201 lb (91.2 kg)   SpO2 98%   BMI 32.44 kg/m   BP Readings from Last 3 Encounters:  08/11/23 134/70  02/08/23 119/68  09/14/22 116/67    Wt Readings from Last 3 Encounters:  08/11/23 201 lb (91.2 kg)  02/08/23 203 lb 12.8 oz (92.4 kg)  09/14/22 198 lb 6.4 oz (90 kg)     Physical Exam Vitals reviewed.  Constitutional:      Appearance: He is well-developed.  HENT:     Head: Normocephalic and atraumatic.     Right Ear: External ear normal.     Left Ear: External ear normal.     Mouth/Throat:     Pharynx: No oropharyngeal exudate or posterior oropharyngeal erythema.  Eyes:     Pupils: Pupils are equal, round, and reactive to light.  Cardiovascular:     Rate and Rhythm: Normal rate and regular rhythm.     Heart sounds: No murmur heard. Pulmonary:     Effort: No respiratory distress.  Breath sounds: Normal breath sounds.  Musculoskeletal:     Cervical back: Normal range of motion and neck supple.  Neurological:     Mental Status: He is alert and oriented to person, place, and time.       Assessment & Plan:   Nathaniel Kelly was seen today for medical management of chronic issues.  Diagnoses and all orders for this visit:  Essential hypertension -     Cancel: CBC with Differential/Platelet -     Cancel: CMP14+EGFR  Mixed hyperlipidemia -     Cancel: CBC with Differential/Platelet -     Cancel: CMP14+EGFR -     Cancel: Lipid panel  Grade III hemorrhoids -      Ambulatory referral to Gastroenterology  Vitamin D deficiency -     Cancel: VITAMIN D 25 Hydroxy (Vit-D Deficiency, Fractures)  Lesion of finger -     Ambulatory referral to Dermatology  Other orders -     pramoxine-hydrocortisone (PROCTOCREAM-HC) 1-1 % rectal cream; Place 1 Application rectally 2 (two) times daily.   Allergies as of 08/11/2023       Reactions   Atorvastatin Other (See Comments)   myalgia   Ezetimibe    Statins Other (See Comments)   Joint problems        Medication List        Accurate as of August 11, 2023  8:47 AM. If you have any questions, ask your nurse or doctor.          STOP taking these medications    fluocinonide cream 0.05 % Commonly known as: LIDEX Stopped by: Yanina Knupp       TAKE these medications    aspirin EC 81 MG tablet Take 81 mg by mouth daily.   lisinopril 20 MG tablet Commonly known as: ZESTRIL Take 1 tablet (20 mg total) by mouth daily.   meloxicam 15 MG tablet Commonly known as: MOBIC Take 1 tablet (15 mg total) by mouth daily.   Mesalamine 400 MG Cpdr DR capsule Commonly known as: ASACOL Take 2 capsules (800 mg total) by mouth 3 (three) times daily.   pramoxine-hydrocortisone 1-1 % rectal cream Commonly known as: PROCTOCREAM-HC Place 1 Application rectally 2 (two) times daily.   rosuvastatin 10 MG tablet Commonly known as: CRESTOR TAKE 1 TABLET BY MOUTH EVERY DAY FOR CHOLESTEROL   Vitamin D (Ergocalciferol) 1.25 MG (50000 UNIT) Caps capsule Commonly known as: DRISDOL TAKE 1 CAPSULE BY MOUTH ONE TIME PER WEEK        Meds ordered this encounter  Medications   pramoxine-hydrocortisone (PROCTOCREAM-HC) 1-1 % rectal cream    Sig: Place 1 Application rectally 2 (two) times daily.    Dispense:  30 g    Refill:  5      Follow-up: Return in about 6 months (around 02/08/2024) for [facility] physical.  Mechele Claude, M.D.

## 2023-08-13 ENCOUNTER — Encounter: Payer: Self-pay | Admitting: Podiatry

## 2023-08-13 ENCOUNTER — Ambulatory Visit (INDEPENDENT_AMBULATORY_CARE_PROVIDER_SITE_OTHER): Payer: Medicare Other | Admitting: Podiatry

## 2023-08-13 VITALS — Ht 66.0 in | Wt 201.0 lb

## 2023-08-13 DIAGNOSIS — Q828 Other specified congenital malformations of skin: Secondary | ICD-10-CM | POA: Diagnosis not present

## 2023-08-13 NOTE — Progress Notes (Signed)
Subjective:  Patient ID: Nathaniel Kelly, male    DOB: 26-Apr-1956,  MRN: [id number]  Chief Complaint  Patient presents with   Callouses    He is here for " I have places on the bottom of my feet and  I would like them removed"     68 y.o. male presents with the above complaint.  Patient presents with bilateral submetatarsal 5 porokeratosis with underlying plantarflexed fifth metatarsal.  Patient states painful to touch painful to walk on he states been on for quite some time is tried to get himself is getting some relief but he goes back and structuring it.  He has not seen MRIs prior to seeing me pain scale 7 out of 10.  He would like me to do something about it.   Review of Systems: Negative except as noted in the HPI. Denies N/V/F/Ch.  Past Medical History:  Diagnosis Date   Colon polyps    Diverticulosis    GERD (gastroesophageal reflux disease)    Hyperlipemia    Hypertension    Internal hemorrhoids without mention of complication 10/19/2012    Current Outpatient Medications:    aspirin EC 81 MG tablet, Take 81 mg by mouth daily., Disp: , Rfl:    lisinopril (ZESTRIL) 20 MG tablet, Take 1 tablet (20 mg total) by mouth daily., Disp: 90 tablet, Rfl: 3   meloxicam (MOBIC) 15 MG tablet, Take 1 tablet (15 mg total) by mouth daily., Disp: 90 tablet, Rfl: 0   Mesalamine (ASACOL) 400 MG CPDR DR capsule, Take 2 capsules (800 mg total) by mouth 3 (three) times daily., Disp: 540 capsule, Rfl: 0   pramoxine-hydrocortisone (PROCTOCREAM-HC) 1-1 % rectal cream, Place 1 Application rectally 2 (two) times daily., Disp: 30 g, Rfl: 5   rosuvastatin (CRESTOR) 10 MG tablet, TAKE 1 TABLET BY MOUTH EVERY DAY FOR CHOLESTEROL, Disp: 90 tablet, Rfl: 3   Vitamin D, Ergocalciferol, (DRISDOL) 1.25 MG (50000 UNIT) CAPS capsule, TAKE 1 CAPSULE BY MOUTH ONE TIME PER WEEK, Disp: 13 capsule, Rfl: 3  Social History   Tobacco Use  Smoking Status Former   Current packs/day: 0.00   Types: Cigarettes   Quit date:  07/20/1986   Years since quitting: 37.0  Smokeless Tobacco Never    Allergies  Allergen Reactions   Atorvastatin Other (See Comments)    myalgia   Ezetimibe    Statins Other (See Comments)    Joint problems   Objective:  There were no vitals filed for this visit. Body mass index is 32.44 kg/m. Constitutional Well developed. Well nourished.  Vascular Dorsalis pedis pulses palpable bilaterally. Posterior tibial pulses palpable bilaterally. Capillary refill normal to all digits.  No cyanosis or clubbing noted. Pedal hair growth normal.  Neurologic Normal speech. Oriented to person, place, and time. Epicritic sensation to light touch grossly present bilaterally.  Dermatologic Hyperkeratotic lesion with central nucleated core noted to submetatarsal 5 with underlying plantarflexed fifth metatarsal noted.  Pain on palpation to the lesion.  Orthopedic: Normal joint ROM without pain or crepitus bilaterally. No visible deformities. No bony tenderness.   Radiographs: None Assessment:  No diagnosis found. Plan:  Patient was evaluated and treated and all questions answered.  Bilateral submetatarsal 5 porokeratosis with underlying plantarflexed fifth metatarsal. -All questions and surgery were discussed with the patient in extensive detail -Using chisel blade handle the lesion was debrided down to healthy dry tissue no complication noted no pinpoint bleeding noted this was done as part of a courtesy -.  I discussed  shoe gear modification padding protecting offloading -If there is no resolving we will discuss surgical options at that time.  Patient agrees with the plan.  No follow-ups on file.

## 2023-08-19 ENCOUNTER — Encounter: Payer: Self-pay | Admitting: Dermatology

## 2023-08-19 ENCOUNTER — Ambulatory Visit: Payer: Medicare Other | Admitting: Dermatology

## 2023-08-19 VITALS — BP 172/85 | HR 58

## 2023-08-19 DIAGNOSIS — D492 Neoplasm of unspecified behavior of bone, soft tissue, and skin: Secondary | ICD-10-CM

## 2023-08-19 DIAGNOSIS — D485 Neoplasm of uncertain behavior of skin: Secondary | ICD-10-CM

## 2023-08-19 MED ORDER — CLOBETASOL PROPIONATE 0.05 % EX OINT: 1.0000 | TOPICAL_OINTMENT | Freq: Two times a day (BID) | CUTANEOUS | 0 refills | Status: AC

## 2023-08-19 NOTE — Patient Instructions (Signed)
Important Information  Due to recent changes in healthcare laws, you may see results of your pathology and/or laboratory studies on MyChart before the doctors have had a chance to review them. We understand that in some cases there may be results that are confusing or concerning to you. Please understand that not all results are received at the same time and often the doctors may need to interpret multiple results in order to provide you with the best plan of care or course of treatment. Therefore, we ask that you please give [place] 2 business days to thoroughly review all your results before contacting the office for clarification. Should we see a critical lab result, you will be contacted sooner.   If You Need Anything After Your Visit  If you have any questions or concerns for your doctor, please call our main line at [phone] If no one answers, please leave a voicemail as directed and we will return your call as soon as possible. Messages left after 4 pm will be answered the following business day.   You may also send [place] a message via MyChart. We typically respond to MyChart messages within 1-2 business days.  For prescription refills, please ask your pharmacy to contact our office. Our fax number is [phone].  If you have an urgent issue when the clinic is closed that cannot wait until the next business day, you can page your doctor at the number below.    Please note that while we do our best to be available for urgent issues outside of office hours, we are not available 24/7.   If you have an urgent issue and are unable to reach [place], you may choose to seek medical care at your doctor's office, retail clinic, urgent care center, or emergency room.  If you have a medical emergency, please immediately call 911 or go to the emergency department. In the event of inclement weather, please call our main line at [phone] for an update on the status of any delays or  closures.  Dermatology Medication Tips: Please keep the boxes that topical medications come in in order to help keep track of the instructions about where and how to use these. Pharmacies typically print the medication instructions only on the boxes and not directly on the medication tubes.   If your medication is too expensive, please contact our office at [phone] or send [place] a message through MyChart.   We are unable to tell what your co-pay for medications will be in advance as this is different depending on your insurance coverage. However, we may be able to find a substitute medication at lower cost or fill out paperwork to get insurance to cover a needed medication.   If a prior authorization is required to get your medication covered by your insurance company, please allow [place] 1-2 business days to complete this process.  Drug prices often vary depending on where the prescription is filled and some pharmacies may offer cheaper prices.  The website www.goodrx.com contains coupons for medications through different pharmacies. The prices here do not account for what the cost may be with help from insurance (it may be cheaper with your insurance), but the website can give you the price if you did not use any insurance.  - You can print the associated coupon and take it with your prescription to the pharmacy.  - You may also stop by our office during regular business hours and pick up a GoodRx coupon card.  - If  you need your prescription sent electronically to a different pharmacy, notify our office through [facility] or by phone at [phone]    Skin Education :   I counseled the patient regarding the following: Sun screen (SPF 30 or greater) should be applied during peak UV exposure (between 10am and 2pm) and reapplied after exercise or swimming.  The ABCDEs of melanoma were reviewed with the patient, and the importance of monthly self-examination of moles was emphasized.  Should any moles change in shape or color, or itch, bleed or burn, pt will contact our office for evaluation sooner then their interval appointment.  Plan: Sunscreen Recommendations I recommended a broad spectrum sunscreen with a SPF of 30 or higher. I explained that SPF 30 sunscreens block approximately 97 percent of the sun's harmful rays. Sunscreens should be applied at least 15 minutes prior to expected sun exposure and then every 2 hours after that as long as sun exposure continues. If swimming or exercising sunscreen should be reapplied every 45 minutes to an hour after getting wet or sweating. One ounce, or the equivalent of a shot glass full of sunscreen, is adequate to protect the skin not covered by a bathing suit. I also recommended a lip balm with a sunscreen as well. Sun protective clothing can be used in lieu of sunscreen but must be worn the entire time you are exposed to the sun's rays.

## 2023-08-19 NOTE — Progress Notes (Signed)
   New Patient Visit   Subjective  Nathaniel Kelly is a 68 y.o. male who presents for the following: Concerned about a spot on his finger. No hx or family hx of skin cancer. Lesion has been there for about 3 months, bleeds and is sores, not previously treated, he showed it to his PCP previously.   The patient has spots, moles and lesions to be evaluated, some may be new or changing.  The following portions of the chart were reviewed this encounter and updated as appropriate: medications, allergies, medical history  Review of Systems:  No other skin or systemic complaints except as noted in HPI or Assessment and Plan.  Objective  Well appearing patient in no apparent distress; mood and affect are within normal limits.   A focused examination was performed of the following areas:  Right 3rd finger    Relevant exam findings are noted in the Assessment and Plan.   Assessment & Plan   Neoplasm of Skin- right 3rd finger- Ddx prurigo nodularis vs xerosis with fissure vs wart vs nmsc vs other The patient was counseled regarding a neoplasm of the skin on the right 3rd finger with a differential diagnosis including prurigo nodularis, xerosis with fissure, non-melanoma skin cancer (NMSC), wart, or other etiologies. Treatment options were discussed, including initiating clobetasol ointment BID to the lesion versus proceeding with a biopsy today for definitive diagnosis. The patient was informed that if we opt for treatment with clobetasol, we will need to reassess the lesion at the next visit, and if there is no significant improvement or resolution, a biopsy will be necessary at that time. The risks and benefits of both approaches were reviewed, and the patient was given the opportunity to ask questions before making a decision on how to proceed.  Patient would like to proceed with clobetasol ointment - clobetasol ointment (TEMOVATE) 0.05 %; Apply 1 Application topically 2 (two) times daily for 28  days.  Dispense: 30 g; Refill: 0  NEOPLASM OF UNCERTAIN BEHAVIOR OF SKIN Right Dorsal Mid 3rd Finger clobetasol ointment (TEMOVATE) 0.05 % Apply 1 Application topically 2 (two) times daily for 28 days.  Return in about 4 weeks (around 09/16/2023) for F/U ON FINGER.  Dominga Ferry, Surg Tech III, am acting as scribe for Gwenith Daily, MD.   Documentation: I have reviewed the above documentation for accuracy and completeness, and I agree with the above.  Gwenith Daily, MD

## 2023-09-02 ENCOUNTER — Other Ambulatory Visit: Payer: Self-pay | Admitting: Family Medicine

## 2023-09-02 DIAGNOSIS — E559 Vitamin D deficiency, unspecified: Secondary | ICD-10-CM

## 2023-09-16 ENCOUNTER — Ambulatory Visit: Payer: Medicare Other | Admitting: Dermatology

## 2023-11-12 ENCOUNTER — Encounter: Payer: Self-pay | Admitting: Gastroenterology

## 2023-11-12 ENCOUNTER — Ambulatory Visit: Admitting: Gastroenterology

## 2023-11-12 VITALS — BP 130/64 | HR 55 | Ht 65.0 in | Wt 194.0 lb

## 2023-11-12 DIAGNOSIS — K6289 Other specified diseases of anus and rectum: Secondary | ICD-10-CM | POA: Diagnosis not present

## 2023-11-12 DIAGNOSIS — K648 Other hemorrhoids: Secondary | ICD-10-CM

## 2023-11-12 DIAGNOSIS — R159 Full incontinence of feces: Secondary | ICD-10-CM

## 2023-11-12 DIAGNOSIS — R195 Other fecal abnormalities: Secondary | ICD-10-CM

## 2023-11-12 DIAGNOSIS — K644 Residual hemorrhoidal skin tags: Secondary | ICD-10-CM | POA: Diagnosis not present

## 2023-11-12 MED ORDER — HYDROCORTISONE (PERIANAL) 2.5 % EX CREA: 1.0000 | TOPICAL_CREAM | Freq: Three times a day (TID) | CUTANEOUS | 1 refills | Status: AC

## 2023-11-12 NOTE — Patient Instructions (Signed)
 We have sent the following medications to your pharmacy for you to pick up at your convenience: Hydrocortisone  2.5 % cream three times daily.  Start Benefiber 2 teaspoons in 8 ounces of liquid daily and may increase to twice daily if tolerated.   _______________________________________________________  If your blood pressure at your visit was 140/90 or greater, please contact your primary care physician to follow up on this.  _______________________________________________________  If you are age 54 or older, your body mass index should be between 23-30. Your Body mass index is 32.28 kg/m. If this is out of the aforementioned range listed, please consider follow up with your Primary Care Provider.  If you are age 64 or younger, your body mass index should be between 19-25. Your Body mass index is 32.28 kg/m. If this is out of the aformentioned range listed, please consider follow up with your Primary Care Provider.   ________________________________________________________  The [facility] providers would like to encourage you to use MYCHART to communicate with providers for non-urgent requests or questions.  Due to long hold times on the telephone, sending your provider a message by [facility] may be a faster and more efficient way to get a response.  Please allow 48 business hours for a response.  Please remember that this is for non-urgent requests.  _______________________________________________________

## 2023-11-12 NOTE — Addendum Note (Signed)
 Addended by: Deniya Craigo D on: 11/12/2023 04:24 PM   Modules accepted: Level of Service

## 2023-11-12 NOTE — Progress Notes (Signed)
 11/12/2023 Nathaniel Kelly [id number] 08-06-1955   HISTORY OF PRESENT ILLNESS: This is a 68 year old male who is a patient of Dr. Revonda Castles, known to him only for colonoscopy in May 2021 as below.  He presents here today at the request of his PCP, Dr. Veleta Gerold, for evaluation regarding hemorrhoids.  Patient tells me that for the past 6 to 8 months he has been having a lot of issues with his hemorrhoids.  He has a lot of mucus discharge from his bottom.  He also complains of fecal incontinence, sometimes leakage of stool, but sometimes more significant bowel movement when lifting, etc.  He says that recently he feels like he cannot completely empty his stools, is back and forth to the bathroom a couple of times in the morning instead of just 1 bowel movement and done.  He says that his hemorrhoids are uncomfortable/hurting and the situation has become aggravating.  His PCP placed him on Asacol /mesalamine  800 mg 3 times daily a few months ago to try to help with the mucus discharge.  He says he feels like it has helped somewhat with the mucus issue.  He says that he has formed normal bowel movements, not diarrhea.  Minimal bleeding.  He says that he uses Metamucil powder, maybe not quite every day, but probably every other day or so.  Colonoscopy 11/2019: - The examined portion of the ileum was normal. - Two 4 to 8 mm polyps in the rectum and in the ascending colon, removed with a cold snare. Resected and retrieved. - Diverticulosis in the left colon and in the right colon. - Internal hemorrhoids. - The examination was otherwise normal on direct and retroflexion views.   Past Medical History:  Diagnosis Date   Colon polyps    Diverticulosis    GERD (gastroesophageal reflux disease)    Hyperlipemia    Hypertension    Internal hemorrhoids without mention of complication 10/19/2012   Past Surgical History:  Procedure Laterality Date   CARPAL TUNNEL RELEASE     COLONOSCOPY  2011   FLEXIBLE SIGMOIDOSCOPY  N/A 11/07/2012   Procedure: FLEXIBLE SIGMOIDOSCOPY;  Surgeon: Claudette Cue, MD;  Location: [facility];  Service: Endoscopy;  Laterality: N/A;   HEMORRHOID BANDING N/A 11/07/2012   Procedure: HEMORRHOID BANDING;  Surgeon: Claudette Cue, MD;  Location: [facility];  Service: Endoscopy;  Laterality: N/A;   HERNIA REPAIR     KNEE ARTHROSCOPY     bilateral     reports that he quit smoking about 37 years ago. His smoking use included cigarettes. He has never used smokeless tobacco. He reports current alcohol use. He reports that he does not use drugs. family history includes Colon cancer in his paternal aunt and paternal grandmother; Heart disease in his maternal grandfather and paternal grandfather. Allergies  Allergen Reactions   Atorvastatin  Other (See Comments)    myalgia   Ezetimibe     Statins Other (See Comments)    Joint problems      Outpatient Encounter Medications as of 11/12/2023  Medication Sig   aspirin EC 81 MG tablet Take 81 mg by mouth daily.   lisinopril  (ZESTRIL ) 20 MG tablet Take 1 tablet (20 mg total) by mouth daily.   meloxicam  (MOBIC ) 15 MG tablet Take 1 tablet (15 mg total) by mouth daily.   Mesalamine  (ASACOL ) 400 MG CPDR DR capsule Take 2 capsules (800 mg total) by mouth 3 (three) times daily.   pramoxine-hydrocortisone  (PROCTOCREAM-HC) 1-1 % rectal cream Place 1  Application rectally 2 (two) times daily.   rosuvastatin  (CRESTOR ) 10 MG tablet TAKE 1 TABLET BY MOUTH EVERY DAY FOR CHOLESTEROL   Vitamin D , Ergocalciferol , (DRISDOL ) 1.25 MG (50000 UNIT) CAPS capsule TAKE 1 CAPSULE BY MOUTH ONE TIME PER WEEK   No facility-administered encounter medications on file as of 11/12/2023.     REVIEW OF SYSTEMS  : All other systems reviewed and negative except where noted in the History of Present Illness.   PHYSICAL EXAM: BP 130/64   Pulse (!) 55   Ht 5\' 5"  (1.651 m)   Wt 194 lb (88 kg)   BMI 32.28 kg/m  General: Well developed white male in no acute  distress Head: Normocephalic and atraumatic Eyes:  Sclerae anicteric, conjunctiva pink. Ears: Normal auditory acuity Rectal: 1 small to medium size external hemorrhoid noted that was inflamed, nonbleeding.  DRE revealed decreased rectal sphincter tone, no masses noted.  No stool on the exam glove.  Anoscopy was performed and showed medium sized somewhat inflamed internal hemorrhoids, non-bleeding. Musculoskeletal: Symmetrical with no gross deformities  Skin: No lesions on visible extremities Extremities: No edema  Neurological: Alert oriented x 4, grossly nonfocal Cervical Nodes:  No significant cervical adenopathy Psychological:  Alert and cooperative. Normal mood and affect  ASSESSMENT AND PLAN: *68 year old male with complaints of hemorrhoids that were seen on exam today.  Also complains of incomplete evacuation/emptying of stools and fecal incontinence and a lot of mucus per rectum.  He does have internal hemorrhoids and 1 external hemorrhoid on exam today, decreased sphincter tone.  He is using Metamucil about every other day or so.  I encouraged him to use fiber supplement daily.  May want to consider Benefiber powder 2 teaspoons in 8 ounces of liquid daily and increase to twice daily if needed to try to help bulk the stool, further eliminate/completely eliminate, etc.  Internal hemorrhoid banding may be helpful, especially with the mucus.  I think that he would benefit from pelvic floor physical therapy.  He would like to try the fiber and hemorrhoid banding.  He will definitely consider pelvic floor physical therapy.  Will prescribe hydrocortisone  cream for him to use on the external hemorrhoid as he has a difficult time using the suppositories, etc.  Will schedule for internal hemorrhoid banding with Dr. Dominic Friendly, although we need to try to find a spot that is available.   CC:  Roise Cleaver, MD

## 2023-11-12 NOTE — Progress Notes (Signed)
 ____________________________________________________________  Attending physician addendum:  Thank you for sending this case to me. I have reviewed the entire note and agree with the plan.  He can go in any follow-up clinic slot.  I will examine him that day probably perform the banding at the same time if looks favorable.  Lorella Roles, MD  ____________________________________________________________

## 2023-11-15 ENCOUNTER — Telehealth: Payer: Self-pay | Admitting: *Deleted

## 2023-11-15 NOTE — Telephone Encounter (Signed)
-----   Message from [name] sent at 11/12/2023  4:43 PM EDT -----  ----- Message ----- From: Albertina Hugger, MD Sent: 11/12/2023   3:43 PM EDT To: [name], PA-C  Any follow-up clinic slot is fine.  I sent an addendum to your note.  HD ----- Message ----- From: [name], PA-C Sent: 11/12/2023  10:12 AM EDT To: Albertina Hugger, MD  Hello.  I think patient would benefit from internal hemorrhoid banding.  Unfortunately you do not have anything available on your schedule at this point.  Would we be able to use any of your follow-up appointments?  Thank you,  Jess

## 2023-11-15 NOTE — Telephone Encounter (Signed)
 Scheduled patient for 6/26 with Dr. Dominic Friendly.

## 2023-11-15 NOTE — Telephone Encounter (Signed)
 Left message for patient to call office.

## 2023-12-14 DIAGNOSIS — M5431 Sciatica, right side: Secondary | ICD-10-CM | POA: Diagnosis not present

## 2023-12-14 DIAGNOSIS — M4126 Other idiopathic scoliosis, lumbar region: Secondary | ICD-10-CM | POA: Diagnosis not present

## 2023-12-14 DIAGNOSIS — M47816 Spondylosis without myelopathy or radiculopathy, lumbar region: Secondary | ICD-10-CM | POA: Diagnosis not present

## 2023-12-14 DIAGNOSIS — M5432 Sciatica, left side: Secondary | ICD-10-CM | POA: Diagnosis not present

## 2023-12-17 DIAGNOSIS — K449 Diaphragmatic hernia without obstruction or gangrene: Secondary | ICD-10-CM | POA: Diagnosis not present

## 2023-12-17 DIAGNOSIS — N289 Disorder of kidney and ureter, unspecified: Secondary | ICD-10-CM | POA: Diagnosis not present

## 2023-12-17 DIAGNOSIS — R93421 Abnormal radiologic findings on diagnostic imaging of right kidney: Secondary | ICD-10-CM | POA: Diagnosis not present

## 2023-12-17 DIAGNOSIS — M47816 Spondylosis without myelopathy or radiculopathy, lumbar region: Secondary | ICD-10-CM | POA: Diagnosis not present

## 2023-12-17 DIAGNOSIS — M4126 Other idiopathic scoliosis, lumbar region: Secondary | ICD-10-CM | POA: Diagnosis not present

## 2023-12-17 DIAGNOSIS — M5431 Sciatica, right side: Secondary | ICD-10-CM | POA: Diagnosis not present

## 2023-12-17 DIAGNOSIS — M5432 Sciatica, left side: Secondary | ICD-10-CM | POA: Diagnosis not present

## 2023-12-17 DIAGNOSIS — M4726 Other spondylosis with radiculopathy, lumbar region: Secondary | ICD-10-CM | POA: Diagnosis not present

## 2023-12-17 DIAGNOSIS — M48061 Spinal stenosis, lumbar region without neurogenic claudication: Secondary | ICD-10-CM | POA: Diagnosis not present

## 2023-12-28 ENCOUNTER — Telehealth: Payer: Self-pay | Admitting: Family Medicine

## 2023-12-28 NOTE — Telephone Encounter (Unsigned)
 Copied from CRM [id number]. Topic: General - Other >> Dec 28, 2023 10:55 AM Nathaniel Kelly wrote: Reason for CRM: Patient wants to speak with Dr Veleta Gerold' nurse about the xray results that were sent to Dr Veleta Gerold. Callback number is [phone]

## 2023-12-28 NOTE — Telephone Encounter (Signed)
Pt. Needs to be seen for this. Thanks, WS 

## 2023-12-29 NOTE — Telephone Encounter (Signed)
 Pt scheduled for tomorrow. LS

## 2023-12-30 ENCOUNTER — Encounter: Payer: Self-pay | Admitting: Family Medicine

## 2023-12-30 ENCOUNTER — Ambulatory Visit (INDEPENDENT_AMBULATORY_CARE_PROVIDER_SITE_OTHER): Admitting: Family Medicine

## 2023-12-30 VITALS — BP 120/78 | HR 90 | Temp 98.2°F | Ht 65.0 in | Wt 182.0 lb

## 2023-12-30 DIAGNOSIS — N2889 Other specified disorders of kidney and ureter: Secondary | ICD-10-CM | POA: Diagnosis not present

## 2023-12-30 DIAGNOSIS — I10 Essential (primary) hypertension: Secondary | ICD-10-CM

## 2023-12-30 NOTE — Progress Notes (Signed)
 Subjective:  Patient ID: Nathaniel Kelly, male    DOB: August 15, 1955  Age: 68 y.o. MRN: [id number]  CC: imaging (US  of right kidney)   HPI [name] presents for recent abdominal skin with CT showed possible kidney lesion.  Patient brings in a copy of that and it was reviewed in detail with him.  It has been sent to be scanned for his chart.  There is a question whether there is a cyst or a solid lesion.  Ultrasound of the kidney was recommended.  He does have a history of high blood pressure and concern for the lesion affecting blood pressure or possibly being cancerous.     12/30/2023    3:39 PM 02/08/2023   11:07 AM 09/14/2022    9:03 AM  Depression screen PHQ 2/9  Decreased Interest 0 0 0  Down, Depressed, Hopeless 0 0 0  PHQ - 2 Score 0 0 0  Altered sleeping 0  1  Tired, decreased energy 0  0  Change in appetite 0  0  Feeling bad or failure about yourself  0  0  Trouble concentrating 0  0  Moving slowly or fidgety/restless 0  0  Suicidal thoughts 0  0  PHQ-9 Score 0  1  Difficult doing work/chores Not difficult at all  Not difficult at all    History Adair has a past medical history of Colon polyps, Diverticulosis, GERD (gastroesophageal reflux disease), Hyperlipemia, Hypertension, and Internal hemorrhoids without mention of complication (10/19/2012).   He has a past surgical history that includes Hernia repair; Knee arthroscopy; Carpal tunnel release; Flexible sigmoidoscopy (N/A, 11/07/2012); Hemorrhoid banding (N/A, 11/07/2012); and Colonoscopy (2011).   His family history includes Colon cancer in his paternal aunt and paternal grandmother; Heart disease in his maternal grandfather and paternal grandfather.He reports that he quit smoking about 37 years ago. His smoking use included cigarettes. He has never used smokeless tobacco. He reports current alcohol use. He reports that he does not use drugs.    ROS Review of Systems  Constitutional:  Negative for fever.  Respiratory:   Negative for shortness of breath.   Cardiovascular:  Negative for chest pain.  Musculoskeletal:  Negative for arthralgias.  Skin:  Negative for rash.    Objective:  BP 120/78   Pulse 90   Temp 98.2 F (36.8 C)   Ht 5' 5 (1.651 m)   Wt 182 lb (82.6 kg)   SpO2 93%   BMI 30.29 kg/m   BP Readings from Last 3 Encounters:  12/30/23 120/78  11/12/23 130/64  08/19/23 (!) 172/85    Wt Readings from Last 3 Encounters:  12/30/23 182 lb (82.6 kg)  11/12/23 194 lb (88 kg)  08/13/23 201 lb (91.2 kg)     Physical Exam Vitals reviewed.  Constitutional:      Appearance: He is well-developed.  HENT:     Head: Normocephalic and atraumatic.     Right Ear: External ear normal.     Left Ear: External ear normal.     Mouth/Throat:     Pharynx: No oropharyngeal exudate or posterior oropharyngeal erythema.   Eyes:     Pupils: Pupils are equal, round, and reactive to light.    Cardiovascular:     Rate and Rhythm: Normal rate and regular rhythm.     Heart sounds: No murmur heard. Pulmonary:     Effort: No respiratory distress.     Breath sounds: Normal breath sounds.   Musculoskeletal:  Cervical back: Normal range of motion and neck supple.   Neurological:     Mental Status: He is alert and oriented to person, place, and time.      Assessment & Plan:  Right renal mass -     BMP8+EGFR -     Urinalysis, Complete -     US  RENAL; Future  Essential hypertension   Blood pressure looks good.  Of note since he was last high he has lost about 20 pounds.  This has been by design.  Follow-up: Return if symptoms worsen or fail to improve.  Roise Cleaver, M.D.

## 2023-12-31 ENCOUNTER — Ambulatory Visit
Admission: RE | Admit: 2023-12-31 | Discharge: 2023-12-31 | Disposition: A | Discharge status: Home / Self Care | Source: Ambulatory Visit | Attending: Family Medicine | Admitting: Family Medicine

## 2023-12-31 ENCOUNTER — Encounter: Payer: Self-pay | Admitting: Family Medicine

## 2023-12-31 DIAGNOSIS — N2889 Other specified disorders of kidney and ureter: Secondary | ICD-10-CM

## 2023-12-31 DIAGNOSIS — N281 Cyst of kidney, acquired: Secondary | ICD-10-CM | POA: Diagnosis not present

## 2023-12-31 DIAGNOSIS — N289 Disorder of kidney and ureter, unspecified: Secondary | ICD-10-CM | POA: Diagnosis not present

## 2023-12-31 LAB — BMP8+EGFR
BUN/Creatinine Ratio: 23 (ref 10–24)
BUN: 31 mg/dL — ABNORMAL HIGH (ref 8–27)
CO2: 21 mmol/L (ref 20–29)
Calcium: 10.5 mg/dL — ABNORMAL HIGH (ref 8.6–10.2)
Chloride: 97 mmol/L (ref 96–106)
Creatinine, Ser: 1.33 mg/dL — ABNORMAL HIGH (ref 0.76–1.27)
Glucose: 80 mg/dL (ref 70–99)
Potassium: 4.6 mmol/L (ref 3.5–5.2)
Sodium: 136 mmol/L (ref 134–144)
eGFR: 58 mL/min/{1.73_m2} — ABNORMAL LOW (ref >59)

## 2023-12-31 LAB — URINALYSIS, COMPLETE
Bilirubin, UA: NEGATIVE
Glucose, UA: NEGATIVE
Leukocytes,UA: NEGATIVE
Nitrite, UA: NEGATIVE
RBC, UA: NEGATIVE
Specific Gravity, UA: >1.03 — ABNORMAL HIGH (ref 1.005–1.030)
Urobilinogen, Ur: 0.2 mg/dL (ref 0.2–1.0)
pH, UA: 5.5 (ref 5.0–7.5)

## 2024-01-04 ENCOUNTER — Other Ambulatory Visit: Payer: Self-pay | Admitting: Family Medicine

## 2024-01-04 ENCOUNTER — Ambulatory Visit: Payer: Self-pay | Admitting: Family Medicine

## 2024-01-04 DIAGNOSIS — N2889 Other specified disorders of kidney and ureter: Secondary | ICD-10-CM

## 2024-01-10 ENCOUNTER — Telehealth: Payer: Self-pay | Admitting: Family Medicine

## 2024-01-10 NOTE — Telephone Encounter (Signed)
 Pt made aware that a CT has been ordered to further evaluate the lesions on his kidneys. Pt wanted to make sure it was a different CT from the one that was performed by [facility]. Confirmed that it is. The CT ordered by [facility] is to evaluated kidneys and abdomen. The [facility] CT was evaluating the spine. Pt agreed and has the CT scheduled 7/3.

## 2024-01-10 NOTE — Telephone Encounter (Signed)
 Copied from CRM [id number]. Topic: General - Call Back - No Documentation >> Jan 07, 2024  5:07 PM DeAngela L wrote: Reason for CRM: patient returning call reading the notes in MyChart and states he was still waiting for the office to call him and explain what's next will he be sent to a urologist or a specialist what is the next step patient is concerned cause he states the results doesn't look to good   Pt num [phone] (H) or [phone] (M)

## 2024-01-13 ENCOUNTER — Other Ambulatory Visit: Payer: Self-pay | Admitting: Family Medicine

## 2024-01-13 ENCOUNTER — Ambulatory Visit: Admitting: Gastroenterology

## 2024-01-20 ENCOUNTER — Ambulatory Visit (HOSPITAL_BASED_OUTPATIENT_CLINIC_OR_DEPARTMENT_OTHER)
Admission: RE | Admit: 2024-01-20 | Discharge: 2024-01-20 | Disposition: A | Discharge status: Home / Self Care | Source: Ambulatory Visit | Attending: Family Medicine | Admitting: Family Medicine

## 2024-01-20 DIAGNOSIS — N200 Calculus of kidney: Secondary | ICD-10-CM | POA: Diagnosis not present

## 2024-01-20 DIAGNOSIS — K449 Diaphragmatic hernia without obstruction or gangrene: Secondary | ICD-10-CM | POA: Diagnosis not present

## 2024-01-20 DIAGNOSIS — D3501 Benign neoplasm of right adrenal gland: Secondary | ICD-10-CM | POA: Diagnosis not present

## 2024-01-20 DIAGNOSIS — N2889 Other specified disorders of kidney and ureter: Secondary | ICD-10-CM | POA: Insufficient documentation

## 2024-01-20 DIAGNOSIS — N289 Disorder of kidney and ureter, unspecified: Secondary | ICD-10-CM | POA: Diagnosis not present

## 2024-01-20 MED ORDER — IOHEXOL 300 MG/ML  SOLN
100.0000 mL | Freq: Once | INTRAMUSCULAR | Status: AC | PRN
  Administered 2024-01-20: 100 mL via INTRAVENOUS

## 2024-01-28 ENCOUNTER — Ambulatory Visit: Payer: Self-pay | Admitting: Family Medicine

## 2024-02-04 ENCOUNTER — Telehealth: Payer: Self-pay

## 2024-02-04 ENCOUNTER — Other Ambulatory Visit: Payer: Self-pay

## 2024-02-04 DIAGNOSIS — E782 Mixed hyperlipidemia: Secondary | ICD-10-CM

## 2024-02-04 DIAGNOSIS — N2889 Other specified disorders of kidney and ureter: Secondary | ICD-10-CM

## 2024-02-04 DIAGNOSIS — E669 Obesity, unspecified: Secondary | ICD-10-CM

## 2024-02-04 DIAGNOSIS — I10 Essential (primary) hypertension: Secondary | ICD-10-CM

## 2024-02-04 NOTE — Telephone Encounter (Signed)
 Labs placed and lab visit scheduled. LS

## 2024-02-04 NOTE — Telephone Encounter (Signed)
 Copied from CRM [id number]. Topic: Clinical - Request for Lab/Test Order >> Feb 04, 2024  8:44 AM Nathaniel Kelly wrote: Reason for CRM: The patient would like orders submitted for their labwork to be completed prior to their Physical scheduled for 02/14/24.   Please contact further when possible

## 2024-02-07 ENCOUNTER — Other Ambulatory Visit

## 2024-02-07 DIAGNOSIS — M47815 Spondylosis without myelopathy or radiculopathy, thoracolumbar region: Secondary | ICD-10-CM | POA: Diagnosis not present

## 2024-02-07 DIAGNOSIS — E782 Mixed hyperlipidemia: Secondary | ICD-10-CM

## 2024-02-07 DIAGNOSIS — M47816 Spondylosis without myelopathy or radiculopathy, lumbar region: Secondary | ICD-10-CM | POA: Diagnosis not present

## 2024-02-07 DIAGNOSIS — M48061 Spinal stenosis, lumbar region without neurogenic claudication: Secondary | ICD-10-CM | POA: Diagnosis not present

## 2024-02-07 DIAGNOSIS — I10 Essential (primary) hypertension: Secondary | ICD-10-CM

## 2024-02-07 DIAGNOSIS — M47817 Spondylosis without myelopathy or radiculopathy, lumbosacral region: Secondary | ICD-10-CM | POA: Diagnosis not present

## 2024-02-07 DIAGNOSIS — E669 Obesity, unspecified: Secondary | ICD-10-CM | POA: Diagnosis not present

## 2024-02-07 DIAGNOSIS — N2889 Other specified disorders of kidney and ureter: Secondary | ICD-10-CM | POA: Diagnosis not present

## 2024-02-07 LAB — CBC WITH DIFFERENTIAL/PLATELET
Basophils Absolute: 0.1 x10E3/uL (ref 0.0–0.2)
Basos: 1 %
EOS (ABSOLUTE): 0.1 x10E3/uL (ref 0.0–0.4)
Eos: 3 %
Hematocrit: 43.2 % (ref 37.5–51.0)
Hemoglobin: 14.1 g/dL (ref 13.0–17.7)
Immature Grans (Abs): 0 x10E3/uL (ref 0.0–0.1)
Immature Granulocytes: 0 %
Lymphocytes Absolute: 1.2 x10E3/uL (ref 0.7–3.1)
Lymphs: 22 %
MCH: 31 pg (ref 26.6–33.0)
MCHC: 32.6 g/dL (ref 31.5–35.7)
MCV: 95 fL (ref 79–97)
Monocytes Absolute: 0.4 x10E3/uL (ref 0.1–0.9)
Monocytes: 8 %
Neutrophils Absolute: 3.5 x10E3/uL (ref 1.4–7.0)
Neutrophils: 66 %
Platelets: 204 x10E3/uL (ref 150–450)
RBC: 4.55 x10E6/uL (ref 4.14–5.80)
RDW: 12.5 % (ref 11.6–15.4)
WBC: 5.3 x10E3/uL (ref 3.4–10.8)

## 2024-02-07 LAB — LIPID PANEL
Chol/HDL Ratio: 3.3 ratio (ref 0.0–5.0)
Cholesterol, Total: 195 mg/dL (ref 100–199)
HDL: 60 mg/dL (ref >39)
LDL Chol Calc (NIH): 118 mg/dL — ABNORMAL HIGH (ref 0–99)
Triglycerides: 94 mg/dL (ref 0–149)
VLDL Cholesterol Cal: 17 mg/dL (ref 5–40)

## 2024-02-07 LAB — BASIC METABOLIC PANEL WITH GFR
BUN/Creatinine Ratio: 26 — ABNORMAL HIGH (ref 10–24)
BUN: 23 mg/dL (ref 8–27)
CO2: 21 mmol/L (ref 20–29)
Calcium: 9.2 mg/dL (ref 8.6–10.2)
Chloride: 101 mmol/L (ref 96–106)
Creatinine, Ser: 0.89 mg/dL (ref 0.76–1.27)
Glucose: 86 mg/dL (ref 70–99)
Potassium: 4.2 mmol/L (ref 3.5–5.2)
Sodium: 137 mmol/L (ref 134–144)
eGFR: 92 mL/min/1.73 (ref >59)

## 2024-02-09 ENCOUNTER — Ambulatory Visit: Payer: Medicare Other | Admitting: Family Medicine

## 2024-02-09 ENCOUNTER — Encounter: Payer: Self-pay | Admitting: Family Medicine

## 2024-02-09 VITALS — BP 112/70 | HR 6 | Temp 98.0°F | Ht 65.0 in | Wt 184.0 lb

## 2024-02-09 DIAGNOSIS — Z0001 Encounter for general adult medical examination with abnormal findings: Secondary | ICD-10-CM | POA: Diagnosis not present

## 2024-02-09 DIAGNOSIS — E782 Mixed hyperlipidemia: Secondary | ICD-10-CM

## 2024-02-09 DIAGNOSIS — I700 Atherosclerosis of aorta: Secondary | ICD-10-CM | POA: Diagnosis not present

## 2024-02-09 DIAGNOSIS — Z23 Encounter for immunization: Secondary | ICD-10-CM

## 2024-02-09 DIAGNOSIS — H6123 Impacted cerumen, bilateral: Secondary | ICD-10-CM | POA: Diagnosis not present

## 2024-02-09 DIAGNOSIS — I10 Essential (primary) hypertension: Secondary | ICD-10-CM

## 2024-02-09 DIAGNOSIS — Z0000 Encounter for general adult medical examination without abnormal findings: Secondary | ICD-10-CM

## 2024-02-09 MED ORDER — MESALAMINE 400 MG PO CPDR: 800.0000 mg | DELAYED_RELEASE_CAPSULE | Freq: Three times a day (TID) | ORAL | 0 refills | Status: DC

## 2024-02-09 MED ORDER — LISINOPRIL 20 MG PO TABS
20.0000 mg | ORAL_TABLET | Freq: Every day | ORAL | 3 refills | Status: AC
Start: 2024-02-09 — End: ?

## 2024-02-09 MED ORDER — ROSUVASTATIN CALCIUM 20 MG PO TABS: ORAL_TABLET | ORAL | 3 refills | Status: AC

## 2024-02-09 MED ORDER — MESALAMINE 400 MG PO CPDR: 800.0000 mg | DELAYED_RELEASE_CAPSULE | Freq: Three times a day (TID) | ORAL | 0 refills | Status: AC

## 2024-02-09 NOTE — Progress Notes (Addendum)
 Subjective:  Patient ID: Nathaniel Kelly, male    DOB: 20-Oct-1955  Age: 68 y.o. MRN: [id number]  CC: Annual Exam (Would like to discus aortic atherosclerosis dx)   HPI Nathaniel Kelly presents for CPE     02/09/2024    8:17 AM 12/30/2023    3:39 PM 02/08/2023   11:07 AM  Depression screen PHQ 2/9  Decreased Interest 0 0 0  Down, Depressed, Hopeless 0 0 0  PHQ - 2 Score 0 0 0  Altered sleeping 0 0   Tired, decreased energy 1 0   Change in appetite 0 0   Feeling bad or failure about yourself  0 0   Trouble concentrating 0 0   Moving slowly or fidgety/restless 0 0   Suicidal thoughts 0 0   PHQ-9 Score 1 0   Difficult doing work/chores Not difficult at all Not difficult at all       02/09/2024    8:17 AM 12/30/2023    3:39 PM 09/14/2022    9:03 AM  GAD 7 : Generalized Anxiety Score  Nervous, Anxious, on Edge 1 0 1  Control/stop worrying 1 0 1  Worry too much - different things 1 0 1  Trouble relaxing 0 0 0  Restless 0 0 0  Easily annoyed or irritable 1 0 0  Afraid - awful might happen 1 0 0  Total GAD 7 Score 5 0 3  Anxiety Difficulty Somewhat difficult Not difficult at all Not difficult at all      History Nathaniel Kelly has a past medical history of Colon polyps, Diverticulosis, GERD (gastroesophageal reflux disease), Hyperlipemia, Hypertension, and Internal hemorrhoids without mention of complication (10/19/2012).   He has a past surgical history that includes Hernia repair; Knee arthroscopy; Carpal tunnel release; Flexible sigmoidoscopy (N/A, 11/07/2012); Hemorrhoid banding (N/A, 11/07/2012); and Colonoscopy (2011).   His family history includes Colon cancer in his paternal aunt and paternal grandmother; Heart disease in his maternal grandfather and paternal grandfather.He reports that he quit smoking about 37 years ago. His smoking use included cigarettes. He has never used smokeless tobacco. He reports current alcohol use. He reports that he does not use drugs.    ROS Review of  Systems  Constitutional:  Negative for activity change, fatigue and unexpected weight change.  HENT:  Positive for hearing loss (somewhat diminshed. Thinks it is wax). Negative for congestion, ear pain, postnasal drip and trouble swallowing.   Eyes:  Negative for pain and visual disturbance.  Respiratory:  Negative for cough, chest tightness and shortness of breath.   Cardiovascular:  Negative for chest pain, palpitations and leg swelling.  Gastrointestinal:  Negative for abdominal distention, abdominal pain, blood in stool, constipation, diarrhea, nausea and vomiting.  Endocrine: Negative for cold intolerance, heat intolerance and polydipsia.  Genitourinary:  Negative for difficulty urinating, dysuria, flank pain, frequency and urgency.  Musculoskeletal:  Negative for arthralgias and joint swelling.  Skin:  Negative for color change, rash and wound.  Neurological:  Negative for dizziness, syncope, speech difficulty, weakness, light-headedness, numbness and headaches.  Hematological:  Does not bruise/bleed easily.  Psychiatric/Behavioral:  Negative for confusion, decreased concentration, dysphoric mood and sleep disturbance. The patient is not nervous/anxious.     Objective:  BP 112/70   Pulse (!) 6   Temp 98 F (36.7 C)   Ht 5' 5 (1.651 m)   Wt 184 lb (83.5 kg)   SpO2 95%   BMI 30.62 kg/m   BP Readings from Last 3 Encounters:  02/09/24 112/70  12/30/23 120/78  11/12/23 130/64    Wt Readings from Last 3 Encounters:  02/09/24 184 lb (83.5 kg)  12/30/23 182 lb (82.6 kg)  11/12/23 194 lb (88 kg)     Physical Exam Constitutional:      Appearance: He is well-developed.  HENT:     Head: Normocephalic and atraumatic.     Right Ear: There is impacted cerumen.     Left Ear: There is impacted cerumen.  Eyes:     Pupils: Pupils are equal, round, and reactive to light.  Neck:     Thyroid : No thyromegaly.     Trachea: No tracheal deviation.  Cardiovascular:     Rate and  Rhythm: Normal rate and regular rhythm.     Heart sounds: Normal heart sounds. No murmur heard.    No friction rub. No gallop.  Pulmonary:     Breath sounds: Normal breath sounds. No wheezing or rales.  Abdominal:     General: Bowel sounds are normal. There is no distension.     Palpations: Abdomen is soft. There is no mass.     Tenderness: There is no abdominal tenderness.     Hernia: There is no hernia in the left inguinal area.  Genitourinary:    Penis: Normal.      Testes: Normal.  Musculoskeletal:        General: Normal range of motion.     Cervical back: Normal range of motion.  Lymphadenopathy:     Cervical: No cervical adenopathy.  Skin:    General: Skin is warm and dry.  Neurological:     Mental Status: He is alert and oriented to person, place, and time.      Assessment & Plan:  Well adult exam  Essential hypertension -     Lisinopril ; Take 1 tablet (20 mg total) by mouth daily.  Dispense: 90 tablet; Refill: 3  Mixed hyperlipidemia -     Rosuvastatin  Calcium ; TAKE 1 TABLET BY MOUTH EVERY DAY FOR CHOLESTEROL  Dispense: 90 tablet; Refill: 3  Aortic atherosclerosis (HCC)  Bilateral impacted cerumen  Other orders -     Mesalamine ; Take 2 capsules (800 mg total) by mouth 3 (three) times daily.  Dispense: 540 capsule; Refill: 0     Follow-up: No follow-ups on file.  Butler Der, M.D.

## 2024-02-09 NOTE — Addendum Note (Signed)
 Addended by: SANDA REA SQUIBB on: 02/09/2024 09:34 AM   Modules accepted: Orders

## 2024-02-09 NOTE — Addendum Note (Signed)
 Addended by: Trevar Boehringer D on: 02/09/2024 09:26 AM   Modules accepted: Orders

## 2024-02-09 NOTE — Progress Notes (Signed)
 Refill failed. resent

## 2024-02-21 DIAGNOSIS — M5416 Radiculopathy, lumbar region: Secondary | ICD-10-CM | POA: Diagnosis not present

## 2024-04-05 ENCOUNTER — Other Ambulatory Visit: Payer: Self-pay | Admitting: Family Medicine

## 2024-04-05 DIAGNOSIS — E782 Mixed hyperlipidemia: Secondary | ICD-10-CM

## 2024-04-07 ENCOUNTER — Other Ambulatory Visit (HOSPITAL_COMMUNITY): Payer: Self-pay

## 2024-04-07 DIAGNOSIS — Z23 Encounter for immunization: Secondary | ICD-10-CM | POA: Diagnosis not present

## 2024-04-07 MED ORDER — COMIRNATY 30 MCG/0.3ML IM SUSY
0.3000 mL | PREFILLED_SYRINGE | Freq: Once | INTRAMUSCULAR | 0 refills | Status: AC
  Filled 2024-04-07: qty 0.3, 1d supply, fill #0

## 2024-04-07 MED ORDER — FLUZONE HIGH-DOSE 0.5 ML IM SUSY
0.5000 mL | PREFILLED_SYRINGE | Freq: Once | INTRAMUSCULAR | 0 refills | Status: AC
  Filled 2024-04-07: qty 0.5, 1d supply, fill #0

## 2024-04-11 ENCOUNTER — Other Ambulatory Visit (HOSPITAL_COMMUNITY): Payer: Self-pay

## 2024-04-27 ENCOUNTER — Other Ambulatory Visit: Payer: Self-pay | Admitting: Family Medicine

## 2024-08-04 ENCOUNTER — Other Ambulatory Visit: Payer: Self-pay | Admitting: Family Medicine

## 2024-08-10 ENCOUNTER — Ambulatory Visit (INDEPENDENT_AMBULATORY_CARE_PROVIDER_SITE_OTHER): Payer: Self-pay | Admitting: Family Medicine

## 2024-08-10 ENCOUNTER — Encounter: Payer: Self-pay | Admitting: Family Medicine

## 2024-08-10 VITALS — BP 131/73 | HR 52 | Temp 97.1°F | Ht 65.0 in | Wt 197.0 lb

## 2024-08-10 DIAGNOSIS — E782 Mixed hyperlipidemia: Secondary | ICD-10-CM

## 2024-08-10 DIAGNOSIS — M5416 Radiculopathy, lumbar region: Secondary | ICD-10-CM

## 2024-08-10 DIAGNOSIS — N401 Enlarged prostate with lower urinary tract symptoms: Secondary | ICD-10-CM | POA: Diagnosis not present

## 2024-08-10 DIAGNOSIS — R351 Nocturia: Secondary | ICD-10-CM

## 2024-08-10 DIAGNOSIS — I10 Essential (primary) hypertension: Secondary | ICD-10-CM | POA: Diagnosis not present

## 2024-08-10 MED ORDER — PREGABALIN 50 MG PO CAPS: ORAL_CAPSULE | ORAL | 0 refills | Status: AC

## 2024-08-10 MED ORDER — MELOXICAM 15 MG PO TABS: 15.0000 mg | ORAL_TABLET | Freq: Every day | ORAL | 3 refills | Status: AC

## 2024-08-10 NOTE — Progress Notes (Signed)
 "  Subjective:  Patient ID: Nathaniel Kelly, male    DOB: May 05, 1956  Age: 69 y.o. MRN: [id number]  CC: Medical Management of Chronic Issues   HPI  Discussed the use of AI scribe software for clinical note transcription with the patient, who gave verbal consent to proceed.  History of Present Illness Nathaniel Kelly is a 69 year old male who presents for management of chronic back pain and nerve pain.  He has chronic back pain that significantly impacts his quality of life. He uses an adjustable bed, which he finds beneficial, and takes meloxicam  for pain relief. Surgery has been considered but is delayed until the pain becomes unbearable.  He experiences nerve pain in his feet and legs, particularly severe in the mornings, which he describes as 'hell'. He believes this pain is related to his back issues. He is considering medication options for nerve pain, including gabapentin and pregabalin .  He manages high cholesterol, with a recent increase in medication dosage from 10 mg to 20 mg. He reports his pain level as 'five out of ten'.  He has hemorrhoids that are bothersome, particularly the external ones. He uses Anusol  and Proctocream for relief.  He has a family history of prostate cancer, with his brother having undergone prostate removal due to cancer. He is vigilant about monitoring his prostate health and has had normal prostate blood tests for the past eight years.          08/10/2024    8:14 AM 02/09/2024    8:17 AM 12/30/2023    3:39 PM  Depression screen PHQ 2/9  Decreased Interest 0 0 0  Down, Depressed, Hopeless 0 0 0  PHQ - 2 Score 0 0 0  Altered sleeping 0 0 0  Tired, decreased energy 0 1 0  Change in appetite 0 0 0  Feeling bad or failure about yourself  0 0 0  Trouble concentrating 0 0 0  Moving slowly or fidgety/restless 0 0 0  Suicidal thoughts 0 0 0  PHQ-9 Score 0 1  0   Difficult doing work/chores Not difficult at all Not difficult at all Not difficult at all      Data saved with a previous flowsheet row definition    History Churchill has a past medical history of Colon polyps, Diverticulosis, GERD (gastroesophageal reflux disease), Hyperlipemia, Hypertension, and Internal hemorrhoids without mention of complication (10/19/2012).   He has a past surgical history that includes Hernia repair; Knee arthroscopy; Carpal tunnel release; Flexible sigmoidoscopy (N/A, 11/07/2012); Hemorrhoid banding (N/A, 11/07/2012); and Colonoscopy (2011).   His family history includes Colon cancer in his paternal aunt and paternal grandmother; Heart disease in his maternal grandfather and paternal grandfather.He reports that he quit smoking about 38 years ago. His smoking use included cigarettes. He has never used smokeless tobacco. He reports current alcohol use. He reports that he does not use drugs.    ROS Review of Systems  Constitutional:  Negative for fever.  Respiratory:  Negative for shortness of breath.   Cardiovascular:  Negative for chest pain.  Musculoskeletal:  Positive for arthralgias (multiple).  Skin:  Negative for rash.    Objective:  BP 131/73   Pulse (!) 52   Temp (!) 97.1 F (36.2 C)   Ht 5' 5 (1.651 m)   Wt 197 lb (89.4 kg)   SpO2 98%   BMI 32.78 kg/m   BP Readings from Last 3 Encounters:  08/10/24 131/73  02/09/24 112/70  12/30/23 120/78  Wt Readings from Last 3 Encounters:  08/10/24 197 lb (89.4 kg)  02/09/24 184 lb (83.5 kg)  12/30/23 182 lb (82.6 kg)     Physical Exam Physical Exam VITALS: BP- 131/73 GENERAL: Alert, cooperative, well developed, no acute distress HEENT: Normocephalic, normal oropharynx, moist mucous membranes CHEST: Clear to auscultation bilaterally, No wheezes, rhonchi, or crackles CARDIOVASCULAR: Normal heart rate and rhythm, S1 and S2 normal without murmurs ABDOMEN: Soft, non-tender, non-distended, without organomegaly, Normal bowel sounds EXTREMITIES: No cyanosis or edema NEUROLOGICAL: Cranial nerves  grossly intact, Moves all extremities without gross motor or sensory deficit   Assessment & Plan:  BPH associated with nocturia -     CBC with Differential/Platelet -     CMP14+EGFR -     PSA, total and free  Essential hypertension -     CBC with Differential/Platelet -     CMP14+EGFR  Mixed hyperlipidemia -     CBC with Differential/Platelet -     CMP14+EGFR -     Lipid panel  Lumbar radiculopathy  Other orders -     Meloxicam ; Take 1 tablet (15 mg total) by mouth daily.  Dispense: 90 tablet; Refill: 3 -     Pregabalin ; 1 qhs X7 days , then 2 qhs X 7d, then 3 qhs X 7d, then 4 qhs  Dispense: 120 capsule; Refill: 0    Assessment and Plan Assessment & Plan Lumbar radiculopathy   Chronic lumbar radiculopathy presents with significant nerve pain in the feet and legs, likely due to nerve impingement in the back. Pain is severe, especially in the mornings, and worsened by the back issue. Start pregabalin , beginning with one tablet at bedtime for a week, then increase to two tablets at bedtime for the second week, and continue titrating up to four tablets at bedtime by the fourth week. A follow-up appointment is scheduled in one month to assess response to pregabalin  and adjust dosage if necessary.  Benign prostatic hyperplasia with lower urinary tract symptoms   Benign prostatic hyperplasia is noted with normal PSA levels for the past eight years and a family history of prostate cancer. A PSA test is ordered.  Mixed hyperlipidemia   Mixed hyperlipidemia is managed with an increased statin dosage from 10 mg to 20 mg due to elevated LDL levels. Previous cholesterol levels were good, but LDL was slightly high. Blood tests are ordered to check cholesterol levels. Continue current statin dosage and monitor liver function tests.  Essential hypertension   Essential hypertension is well-controlled with lisinopril . Recent blood pressure reading was 131/73 mmHg. Continue lisinopril  for blood  pressure management.  Hemorrhoids   Chronic hemorrhoids with external hemorrhoids cause discomfort. Previous advice was against banding due to the external nature of hemorrhoids. Surgical removal is not recommended unless causing significant daily misery. Consider referral to a surgeon if external hemorrhoids cause significant discomfort.       Follow-up: Return in about 1 month (around 09/10/2024) for Lyrica  neuropathy recheck.  Butler Der, M.D. "

## 2024-08-11 LAB — LIPID PANEL
Chol/HDL Ratio: 2.6 ratio (ref 0.0–5.0)
Cholesterol, Total: 174 mg/dL (ref 100–199)
HDL: 66 mg/dL
LDL Chol Calc (NIH): 96 mg/dL (ref 0–99)
Triglycerides: 61 mg/dL (ref 0–149)
VLDL Cholesterol Cal: 12 mg/dL (ref 5–40)

## 2024-08-11 LAB — CBC WITH DIFFERENTIAL/PLATELET
Basophils Absolute: 0 x10E3/uL (ref 0.0–0.2)
Basos: 1 %
EOS (ABSOLUTE): 0.1 x10E3/uL (ref 0.0–0.4)
Eos: 2 %
Hematocrit: 45 % (ref 37.5–51.0)
Hemoglobin: 14.7 g/dL (ref 13.0–17.7)
Immature Grans (Abs): 0 x10E3/uL (ref 0.0–0.1)
Immature Granulocytes: 0 %
Lymphocytes Absolute: 1.4 x10E3/uL (ref 0.7–3.1)
Lymphs: 25 %
MCH: 31.1 pg (ref 26.6–33.0)
MCHC: 32.7 g/dL (ref 31.5–35.7)
MCV: 95 fL (ref 79–97)
Monocytes Absolute: 0.5 x10E3/uL (ref 0.1–0.9)
Monocytes: 9 %
Neutrophils Absolute: 3.6 x10E3/uL (ref 1.4–7.0)
Neutrophils: 63 %
Platelets: 220 x10E3/uL (ref 150–450)
RBC: 4.72 x10E6/uL (ref 4.14–5.80)
RDW: 12.4 % (ref 11.6–15.4)
WBC: 5.7 x10E3/uL (ref 3.4–10.8)

## 2024-08-11 LAB — CMP14+EGFR
ALT: 9 IU/L (ref 0–44)
AST: 26 IU/L (ref 0–40)
Albumin: 4.2 g/dL (ref 3.8–4.8)
Alkaline Phosphatase: 70 IU/L (ref 47–123)
BUN/Creatinine Ratio: 15 (ref 10–24)
BUN: 14 mg/dL (ref 8–27)
Bilirubin Total: 1.2 mg/dL (ref 0.0–1.2)
CO2: 23 mmol/L (ref 20–29)
Calcium: 9.4 mg/dL (ref 8.6–10.2)
Chloride: 101 mmol/L (ref 96–106)
Creatinine, Ser: 0.93 mg/dL (ref 0.76–1.27)
Globulin, Total: 2.3 g/dL (ref 1.5–4.5)
Glucose: 84 mg/dL (ref 70–99)
Potassium: 4.4 mmol/L (ref 3.5–5.2)
Sodium: 136 mmol/L (ref 134–144)
Total Protein: 6.5 g/dL (ref 6.0–8.5)
eGFR: 88 mL/min/1.73

## 2024-08-11 LAB — PSA, TOTAL AND FREE
PSA, Free Pct: 28.7 %
PSA, Free: 0.89 ng/mL
Prostate Specific Ag, Serum: 3.1 ng/mL (ref 0.0–4.0)

## 2024-08-14 ENCOUNTER — Ambulatory Visit: Payer: Self-pay | Admitting: Family Medicine

## 2024-08-14 NOTE — Progress Notes (Signed)
 Hello Jakaleb,  Your lab result is normal and/or stable.Some minor variations that are not significant are commonly marked abnormal, but do not represent any medical problem for you.  Best regards, Butler Der, M.D.

## 2024-09-12 ENCOUNTER — Ambulatory Visit: Admitting: Family Medicine
